# Patient Record
Sex: Male | Born: 1980 | Race: Black or African American | Hispanic: No | Marital: Single
Health system: Southern US, Community
[De-identification: ages and names within clinical notes are randomized; demographics above are authoritative.]

## PROBLEM LIST (undated history)

## (undated) DIAGNOSIS — F209 Schizophrenia, unspecified: Secondary | ICD-10-CM

## (undated) DIAGNOSIS — F1910 Other psychoactive substance abuse, uncomplicated: Secondary | ICD-10-CM

---

## 2006-03-21 ENCOUNTER — Ambulatory Visit: Payer: Self-pay

## 2006-12-30 ENCOUNTER — Inpatient Hospital Stay: Payer: Self-pay | Admitting: Unknown Physician Specialty

## 2007-01-08 ENCOUNTER — Emergency Department: Payer: Self-pay | Admitting: Emergency Medicine

## 2008-08-30 ENCOUNTER — Emergency Department: Payer: Self-pay | Admitting: Emergency Medicine

## 2008-11-23 ENCOUNTER — Emergency Department: Payer: Self-pay | Admitting: Emergency Medicine

## 2009-08-23 ENCOUNTER — Emergency Department: Payer: Self-pay | Admitting: Emergency Medicine

## 2009-09-24 ENCOUNTER — Emergency Department: Payer: Self-pay | Admitting: Emergency Medicine

## 2009-11-12 ENCOUNTER — Inpatient Hospital Stay: Payer: Self-pay | Admitting: Unknown Physician Specialty

## 2010-05-31 ENCOUNTER — Ambulatory Visit: Payer: Self-pay | Admitting: Internal Medicine

## 2010-08-06 ENCOUNTER — Inpatient Hospital Stay: Payer: Self-pay | Admitting: Unknown Physician Specialty

## 2011-11-19 ENCOUNTER — Emergency Department: Payer: Self-pay | Admitting: Emergency Medicine

## 2011-11-19 LAB — SALICYLATE LEVEL: Salicylates, Serum: 2.2 mg/dL

## 2011-11-19 LAB — URINALYSIS, COMPLETE
Bilirubin,UR: NEGATIVE
Blood: NEGATIVE
Nitrite: NEGATIVE
Ph: 5 (ref 4.5–8.0)
Protein: NEGATIVE
RBC,UR: NONE SEEN /HPF (ref 0–5)
Specific Gravity: 1.011 (ref 1.003–1.030)

## 2011-11-19 LAB — DRUG SCREEN, URINE
Amphetamines, Ur Screen: NEGATIVE (ref ?–1000)
Barbiturates, Ur Screen: NEGATIVE (ref ?–200)
Benzodiazepine, Ur Scrn: NEGATIVE (ref ?–200)
Cannabinoid 50 Ng, Ur ~~LOC~~: NEGATIVE (ref ?–50)
Cocaine Metabolite,Ur ~~LOC~~: NEGATIVE (ref ?–300)
Phencyclidine (PCP) Ur S: NEGATIVE (ref ?–25)

## 2011-11-19 LAB — COMPREHENSIVE METABOLIC PANEL
Albumin: 4.1 g/dL (ref 3.4–5.0)
Anion Gap: 11 (ref 7–16)
BUN: 14 mg/dL (ref 7–18)
Bilirubin,Total: 0.5 mg/dL (ref 0.2–1.0)
Chloride: 103 mmol/L (ref 98–107)
Co2: 26 mmol/L (ref 21–32)
Creatinine: 1 mg/dL (ref 0.60–1.30)
EGFR (African American): >60
EGFR (Non-African Amer.): >60
Glucose: 103 mg/dL — ABNORMAL HIGH (ref 65–99)
Osmolality: 280 (ref 275–301)
Potassium: 3.6 mmol/L (ref 3.5–5.1)
Sodium: 140 mmol/L (ref 136–145)
Total Protein: 7.7 g/dL (ref 6.4–8.2)

## 2011-11-19 LAB — ETHANOL
Ethanol %: 0.014 % (ref 0.000–0.080)
Ethanol: 14 mg/dL

## 2011-11-19 LAB — ACETAMINOPHEN LEVEL: Acetaminophen: <2 ug/mL

## 2011-11-19 LAB — CBC
HCT: 44.3 % (ref 40.0–52.0)
HGB: 15.4 g/dL (ref 13.0–18.0)
MCH: 33.8 pg (ref 26.0–34.0)
MCHC: 34.7 g/dL (ref 32.0–36.0)
MCV: 98 fL (ref 80–100)
RDW: 12.2 % (ref 11.5–14.5)
WBC: 6.4 10*3/uL (ref 3.8–10.6)

## 2011-11-19 LAB — T4, FREE: Free Thyroxine: 1.38 ng/dL (ref 0.76–1.46)

## 2011-11-19 LAB — TSH: Thyroid Stimulating Horm: <0.01 u[IU]/mL — ABNORMAL LOW

## 2011-11-21 ENCOUNTER — Inpatient Hospital Stay: Payer: Self-pay | Admitting: Psychiatry

## 2011-11-21 LAB — CBC
MCH: 33.1 pg (ref 26.0–34.0)
MCHC: 33.8 g/dL (ref 32.0–36.0)
MCV: 98 fL (ref 80–100)
Platelet: 163 10*3/uL (ref 150–440)
RBC: 4.43 10*6/uL (ref 4.40–5.90)

## 2011-11-21 LAB — DRUG SCREEN, URINE
Amphetamines, Ur Screen: NEGATIVE (ref ?–1000)
Barbiturates, Ur Screen: NEGATIVE (ref ?–200)
Cannabinoid 50 Ng, Ur ~~LOC~~: NEGATIVE (ref ?–50)
Methadone, Ur Screen: NEGATIVE (ref ?–300)
Opiate, Ur Screen: NEGATIVE (ref ?–300)
Tricyclic, Ur Screen: NEGATIVE (ref ?–1000)

## 2011-11-21 LAB — COMPREHENSIVE METABOLIC PANEL
Albumin: 4.3 g/dL (ref 3.4–5.0)
Alkaline Phosphatase: 128 U/L (ref 50–136)
BUN: 13 mg/dL (ref 7–18)
Bilirubin,Total: 0.4 mg/dL (ref 0.2–1.0)
Creatinine: 0.9 mg/dL (ref 0.60–1.30)
Osmolality: 279 (ref 275–301)
Sodium: 140 mmol/L (ref 136–145)
Total Protein: 7.8 g/dL (ref 6.4–8.2)

## 2011-11-21 LAB — TSH: Thyroid Stimulating Horm: <0.01 u[IU]/mL — ABNORMAL LOW

## 2011-11-21 LAB — ACETAMINOPHEN LEVEL: Acetaminophen: <2 ug/mL

## 2013-10-11 ENCOUNTER — Emergency Department: Payer: Self-pay | Admitting: Emergency Medicine

## 2013-10-11 LAB — COMPREHENSIVE METABOLIC PANEL
ALK PHOS: 128 U/L — AB
AST: 22 U/L (ref 15–37)
Albumin: 3.9 g/dL (ref 3.4–5.0)
Anion Gap: 7 (ref 7–16)
BUN: 7 mg/dL (ref 7–18)
Bilirubin,Total: 0.4 mg/dL (ref 0.2–1.0)
CREATININE: 0.85 mg/dL (ref 0.60–1.30)
Calcium, Total: 8.8 mg/dL (ref 8.5–10.1)
Chloride: 103 mmol/L (ref 98–107)
Co2: 27 mmol/L (ref 21–32)
EGFR (Non-African Amer.): >60
Glucose: 140 mg/dL — ABNORMAL HIGH (ref 65–99)
OSMOLALITY: 274 (ref 275–301)
POTASSIUM: 3.6 mmol/L (ref 3.5–5.1)
SGPT (ALT): 22 U/L
Sodium: 137 mmol/L (ref 136–145)
Total Protein: 7.7 g/dL (ref 6.4–8.2)

## 2013-10-11 LAB — URINALYSIS, COMPLETE
BILIRUBIN, UR: NEGATIVE
Bacteria: NONE SEEN
Blood: NEGATIVE
GLUCOSE, UR: NEGATIVE mg/dL (ref 0–75)
Hyaline Cast: 2
KETONE: NEGATIVE
Leukocyte Esterase: NEGATIVE
NITRITE: NEGATIVE
Ph: 5 (ref 4.5–8.0)
Protein: NEGATIVE
Specific Gravity: 1.023 (ref 1.003–1.030)
Squamous Epithelial: <1
WBC UR: NONE SEEN /HPF (ref 0–5)

## 2013-10-11 LAB — CBC
HCT: 45.5 % (ref 40.0–52.0)
HGB: 15 g/dL (ref 13.0–18.0)
MCH: 31 pg (ref 26.0–34.0)
MCHC: 33.1 g/dL (ref 32.0–36.0)
MCV: 94 fL (ref 80–100)
Platelet: 159 10*3/uL (ref 150–440)
RBC: 4.84 10*6/uL (ref 4.40–5.90)
RDW: 12.7 % (ref 11.5–14.5)
WBC: 4.4 10*3/uL (ref 3.8–10.6)

## 2013-10-11 LAB — DRUG SCREEN, URINE
Amphetamines, Ur Screen: NEGATIVE (ref ?–1000)
BARBITURATES, UR SCREEN: NEGATIVE (ref ?–200)
BENZODIAZEPINE, UR SCRN: NEGATIVE (ref ?–200)
CANNABINOID 50 NG, UR ~~LOC~~: POSITIVE (ref ?–50)
Cocaine Metabolite,Ur ~~LOC~~: NEGATIVE (ref ?–300)
MDMA (Ecstasy)Ur Screen: NEGATIVE (ref ?–500)
Methadone, Ur Screen: NEGATIVE (ref ?–300)
Opiate, Ur Screen: NEGATIVE (ref ?–300)
PHENCYCLIDINE (PCP) UR S: NEGATIVE (ref ?–25)
TRICYCLIC, UR SCREEN: NEGATIVE (ref ?–1000)

## 2013-10-11 LAB — SALICYLATE LEVEL: Salicylates, Serum: 2.1 mg/dL

## 2013-10-11 LAB — ETHANOL: Ethanol: <3 mg/dL

## 2013-10-11 LAB — ACETAMINOPHEN LEVEL: Acetaminophen: <2 ug/mL

## 2014-03-03 ENCOUNTER — Ambulatory Visit: Payer: Self-pay | Admitting: Neurology

## 2014-03-03 ENCOUNTER — Inpatient Hospital Stay: Payer: Self-pay

## 2014-03-03 DIAGNOSIS — I639 Cerebral infarction, unspecified: Secondary | ICD-10-CM

## 2014-03-03 LAB — CBC WITH DIFFERENTIAL/PLATELET
BASOS ABS: 0 10*3/uL (ref 0.0–0.1)
BASOS PCT: 0.1 %
Eosinophil #: 0 10*3/uL (ref 0.0–0.7)
Eosinophil %: 0.3 %
HCT: 45.1 % (ref 40.0–52.0)
HGB: 14.9 g/dL (ref 13.0–18.0)
LYMPHS ABS: 0.7 10*3/uL — AB (ref 1.0–3.6)
LYMPHS PCT: 8.6 %
MCH: 29.4 pg (ref 26.0–34.0)
MCHC: 33.1 g/dL (ref 32.0–36.0)
MCV: 89 fL (ref 80–100)
Monocyte #: 0.5 x10 3/mm (ref 0.2–1.0)
Monocyte %: 6.7 %
NEUTROS ABS: 6.8 10*3/uL — AB (ref 1.4–6.5)
NEUTROS PCT: 84.3 %
Platelet: 184 10*3/uL (ref 150–440)
RBC: 5.07 10*6/uL (ref 4.40–5.90)
RDW: 12 % (ref 11.5–14.5)
WBC: 8.1 10*3/uL (ref 3.8–10.6)

## 2014-03-03 LAB — COMPREHENSIVE METABOLIC PANEL
ALBUMIN: 3.9 g/dL (ref 3.4–5.0)
ANION GAP: 12 (ref 7–16)
Alkaline Phosphatase: 136 U/L — ABNORMAL HIGH
BILIRUBIN TOTAL: 0.6 mg/dL (ref 0.2–1.0)
BUN: 20 mg/dL — AB (ref 7–18)
CALCIUM: 9.8 mg/dL (ref 8.5–10.1)
Chloride: 104 mmol/L (ref 98–107)
Co2: 24 mmol/L (ref 21–32)
Creatinine: 0.98 mg/dL (ref 0.60–1.30)
EGFR (African American): >60
Glucose: 157 mg/dL — ABNORMAL HIGH (ref 65–99)
OSMOLALITY: 285 (ref 275–301)
Potassium: 4.1 mmol/L (ref 3.5–5.1)
SGOT(AST): 38 U/L — ABNORMAL HIGH (ref 15–37)
SGPT (ALT): 34 U/L
Sodium: 140 mmol/L (ref 136–145)
TOTAL PROTEIN: 8.1 g/dL (ref 6.4–8.2)

## 2014-03-03 LAB — URINALYSIS, COMPLETE
Bilirubin,UR: NEGATIVE
Blood: NEGATIVE
Glucose,UR: NEGATIVE mg/dL (ref 0–75)
Ketone: NEGATIVE
LEUKOCYTE ESTERASE: NEGATIVE
Nitrite: NEGATIVE
Ph: 5 (ref 4.5–8.0)
Protein: NEGATIVE
SPECIFIC GRAVITY: 1.027 (ref 1.003–1.030)
Squamous Epithelial: NONE SEEN

## 2014-03-03 LAB — DRUG SCREEN, URINE
AMPHETAMINES, UR SCREEN: NEGATIVE (ref ?–1000)
Barbiturates, Ur Screen: NEGATIVE (ref ?–200)
Benzodiazepine, Ur Scrn: POSITIVE (ref ?–200)
Cannabinoid 50 Ng, Ur ~~LOC~~: NEGATIVE (ref ?–50)
Cocaine Metabolite,Ur ~~LOC~~: NEGATIVE (ref ?–300)
MDMA (ECSTASY) UR SCREEN: NEGATIVE (ref ?–500)
Methadone, Ur Screen: NEGATIVE (ref ?–300)
Opiate, Ur Screen: NEGATIVE (ref ?–300)
PHENCYCLIDINE (PCP) UR S: NEGATIVE (ref ?–25)
Tricyclic, Ur Screen: NEGATIVE (ref ?–1000)

## 2014-03-03 LAB — TROPONIN I
Troponin-I: 0.1 ng/mL — ABNORMAL HIGH
Troponin-I: 0.19 ng/mL — ABNORMAL HIGH
Troponin-I: 0.15 ng/mL — ABNORMAL HIGH

## 2014-03-03 LAB — CK TOTAL AND CKMB (NOT AT ARMC)
CK, TOTAL: 113 U/L (ref 39–308)
CK, TOTAL: 153 U/L (ref 39–308)
CK, Total: 148 U/L (ref 39–308)
CK, Total: 135 U/L (ref 39–308)
CK-MB: 1 ng/mL (ref 0.5–3.6)
CK-MB: 0.9 ng/mL (ref 0.5–3.6)
CK-MB: 0.9 ng/mL (ref 0.5–3.6)
CK-MB: 0.9 ng/mL (ref 0.5–3.6)

## 2014-03-04 LAB — BASIC METABOLIC PANEL
Anion Gap: 9 (ref 7–16)
BUN: 13 mg/dL (ref 7–18)
CHLORIDE: 107 mmol/L (ref 98–107)
CO2: 26 mmol/L (ref 21–32)
Calcium, Total: 8.5 mg/dL (ref 8.5–10.1)
Creatinine: 1.07 mg/dL (ref 0.60–1.30)
EGFR (African American): >60
Glucose: 168 mg/dL — ABNORMAL HIGH (ref 65–99)
OSMOLALITY: 287 (ref 275–301)
Potassium: 3.4 mmol/L — ABNORMAL LOW (ref 3.5–5.1)
Sodium: 142 mmol/L (ref 136–145)

## 2014-03-04 LAB — CBC WITH DIFFERENTIAL/PLATELET
Basophil #: 0 10*3/uL (ref 0.0–0.1)
Basophil %: 0.3 %
EOS PCT: 0.8 %
Eosinophil #: 0 10*3/uL (ref 0.0–0.7)
HCT: 40.1 % (ref 40.0–52.0)
HGB: 12.9 g/dL — ABNORMAL LOW (ref 13.0–18.0)
Lymphocyte #: 2 10*3/uL (ref 1.0–3.6)
Lymphocyte %: 35.8 %
MCH: 29.1 pg (ref 26.0–34.0)
MCHC: 32.1 g/dL (ref 32.0–36.0)
MCV: 91 fL (ref 80–100)
MONOS PCT: 16.3 %
Monocyte #: 0.9 x10 3/mm (ref 0.2–1.0)
NEUTROS ABS: 2.6 10*3/uL (ref 1.4–6.5)
NEUTROS PCT: 46.8 %
Platelet: 134 10*3/uL — ABNORMAL LOW (ref 150–440)
RBC: 4.43 10*6/uL (ref 4.40–5.90)
RDW: 12.1 % (ref 11.5–14.5)
WBC: 5.5 10*3/uL (ref 3.8–10.6)

## 2014-03-04 LAB — MAGNESIUM: Magnesium: 1.4 mg/dL — ABNORMAL LOW

## 2014-03-04 LAB — PHOSPHORUS: PHOSPHORUS: 4.5 mg/dL (ref 2.5–4.9)

## 2014-03-05 LAB — COMPREHENSIVE METABOLIC PANEL
ALBUMIN: 2.5 g/dL — AB (ref 3.4–5.0)
ALT: 22 U/L
Alkaline Phosphatase: 83 U/L
Anion Gap: 8 (ref 7–16)
BUN: 13 mg/dL (ref 7–18)
Bilirubin,Total: 1.1 mg/dL — ABNORMAL HIGH (ref 0.2–1.0)
Calcium, Total: 8 mg/dL — ABNORMAL LOW (ref 8.5–10.1)
Chloride: 106 mmol/L (ref 98–107)
Co2: 26 mmol/L (ref 21–32)
Creatinine: 0.85 mg/dL (ref 0.60–1.30)
EGFR (Non-African Amer.): >60
GLUCOSE: 157 mg/dL — AB (ref 65–99)
Osmolality: 283 (ref 275–301)
Potassium: 4.1 mmol/L (ref 3.5–5.1)
SGOT(AST): 28 U/L (ref 15–37)
Sodium: 140 mmol/L (ref 136–145)
TOTAL PROTEIN: 5.4 g/dL — AB (ref 6.4–8.2)

## 2014-03-05 LAB — CBC WITH DIFFERENTIAL/PLATELET
BASOS PCT: 0.3 %
Basophil #: 0 10*3/uL (ref 0.0–0.1)
Comment - H1-Com1: NORMAL
Comment - H1-Com2: NORMAL
EOS ABS: 0 10*3/uL (ref 0.0–0.7)
Eosinophil %: 0.5 %
HCT: 37.5 % — AB (ref 40.0–52.0)
HCT: 34.1 % — ABNORMAL LOW (ref 40.0–52.0)
HGB: 12.3 g/dL — AB (ref 13.0–18.0)
HGB: 11 g/dL — ABNORMAL LOW (ref 13.0–18.0)
LYMPHS ABS: 1.7 10*3/uL (ref 1.0–3.6)
Lymphocyte %: 30 %
Lymphocytes: 18 %
MCH: 29.4 pg (ref 26.0–34.0)
MCH: 29.2 pg (ref 26.0–34.0)
MCHC: 32.8 g/dL (ref 32.0–36.0)
MCHC: 32.3 g/dL (ref 32.0–36.0)
MCV: 90 fL (ref 80–100)
MCV: 91 fL (ref 80–100)
MONO ABS: 0.9 x10 3/mm (ref 0.2–1.0)
Monocyte %: 15.7 %
Monocytes: 8 %
Neutrophil #: 3.1 10*3/uL (ref 1.4–6.5)
Neutrophil %: 53.5 %
PLATELETS: 109 10*3/uL — AB (ref 150–440)
Platelet: 116 10*3/uL — ABNORMAL LOW (ref 150–440)
RBC: 4.19 10*6/uL — ABNORMAL LOW (ref 4.40–5.90)
RBC: 3.77 10*6/uL — ABNORMAL LOW (ref 4.40–5.90)
RDW: 12 % (ref 11.5–14.5)
RDW: 12.2 % (ref 11.5–14.5)
SEGMENTED NEUTROPHILS: 74 %
WBC: 5.8 10*3/uL (ref 3.8–10.6)
WBC: 8.1 10*3/uL (ref 3.8–10.6)

## 2014-03-05 LAB — ALBUMIN: ALBUMIN: 2.7 g/dL — AB (ref 3.4–5.0)

## 2014-03-05 LAB — CSF CELL CT + PROT + GLU PANEL
CSF Tube #: 3
Eosinophil: 0 %
Glucose, CSF: 86 mg/dL — ABNORMAL HIGH (ref 40–75)
Lymphocytes: 8 %
Monocytes/Macrophages: 7 %
NEUTROS PCT: 85 %
Other Cells: 0 %
Protein, CSF: 72 mg/dL — ABNORMAL HIGH (ref 15–45)
RBC (CSF): 83 /mm3
WBC (CSF): 356 /mm3

## 2014-03-05 LAB — RAPID HIV SCREEN (HIV 1/2 AB+AG)

## 2014-03-05 LAB — BASIC METABOLIC PANEL
Anion Gap: 6 — ABNORMAL LOW (ref 7–16)
BUN: 12 mg/dL (ref 7–18)
CALCIUM: 8.9 mg/dL (ref 8.5–10.1)
CHLORIDE: 107 mmol/L (ref 98–107)
Co2: 27 mmol/L (ref 21–32)
Creatinine: 0.93 mg/dL (ref 0.60–1.30)
EGFR (African American): >60
EGFR (Non-African Amer.): >60
Glucose: 141 mg/dL — ABNORMAL HIGH (ref 65–99)
Osmolality: 282 (ref 275–301)
POTASSIUM: 4.1 mmol/L (ref 3.5–5.1)
Sodium: 140 mmol/L (ref 136–145)

## 2014-03-05 LAB — VANCOMYCIN, TROUGH: VANCOMYCIN, TROUGH: 7 ug/mL — AB (ref 10–20)

## 2014-03-05 LAB — MAGNESIUM
Magnesium: 1.5 mg/dL — ABNORMAL LOW
Magnesium: 1.4 mg/dL — ABNORMAL LOW

## 2014-03-05 LAB — SEDIMENTATION RATE: Erythrocyte Sed Rate: 63 mm/hr — ABNORMAL HIGH (ref 0–15)

## 2014-03-05 LAB — PHOSPHORUS: Phosphorus: 3.8 mg/dL (ref 2.5–4.9)

## 2014-03-05 LAB — PHENYTOIN LEVEL, TOTAL
DILANTIN: 12.8 ug/mL (ref 10.0–20.0)
DILANTIN: 11.3 ug/mL (ref 10.0–20.0)

## 2014-03-06 ENCOUNTER — Encounter (HOSPITAL_COMMUNITY): Payer: Self-pay | Admitting: Internal Medicine

## 2014-03-06 ENCOUNTER — Inpatient Hospital Stay (HOSPITAL_COMMUNITY): Payer: Medicare Other

## 2014-03-06 ENCOUNTER — Inpatient Hospital Stay (HOSPITAL_COMMUNITY)
Admission: AD | Admit: 2014-03-06 | Discharge: 2014-04-05 | DRG: 064 | Disposition: E | Payer: Medicare Other | Source: Other Acute Inpatient Hospital | Attending: Internal Medicine | Admitting: Internal Medicine

## 2014-03-06 DIAGNOSIS — I639 Cerebral infarction, unspecified: Secondary | ICD-10-CM

## 2014-03-06 DIAGNOSIS — F209 Schizophrenia, unspecified: Secondary | ICD-10-CM | POA: Diagnosis not present

## 2014-03-06 DIAGNOSIS — I63431 Cerebral infarction due to embolism of right posterior cerebral artery: Secondary | ICD-10-CM | POA: Diagnosis not present

## 2014-03-06 DIAGNOSIS — I63412 Cerebral infarction due to embolism of left middle cerebral artery: Principal | ICD-10-CM | POA: Diagnosis present

## 2014-03-06 DIAGNOSIS — G9340 Encephalopathy, unspecified: Secondary | ICD-10-CM | POA: Diagnosis not present

## 2014-03-06 DIAGNOSIS — G40901 Epilepsy, unspecified, not intractable, with status epilepticus: Secondary | ICD-10-CM | POA: Diagnosis not present

## 2014-03-06 DIAGNOSIS — G936 Cerebral edema: Secondary | ICD-10-CM | POA: Diagnosis not present

## 2014-03-06 DIAGNOSIS — I629 Nontraumatic intracranial hemorrhage, unspecified: Secondary | ICD-10-CM | POA: Diagnosis present

## 2014-03-06 DIAGNOSIS — Z79899 Other long term (current) drug therapy: Secondary | ICD-10-CM | POA: Diagnosis not present

## 2014-03-06 DIAGNOSIS — Z66 Do not resuscitate: Secondary | ICD-10-CM | POA: Diagnosis present

## 2014-03-06 DIAGNOSIS — I63531 Cerebral infarction due to unspecified occlusion or stenosis of right posterior cerebral artery: Secondary | ICD-10-CM | POA: Diagnosis not present

## 2014-03-06 DIAGNOSIS — R569 Unspecified convulsions: Secondary | ICD-10-CM | POA: Diagnosis not present

## 2014-03-06 DIAGNOSIS — G935 Compression of brain: Secondary | ICD-10-CM | POA: Diagnosis present

## 2014-03-06 DIAGNOSIS — Z01818 Encounter for other preprocedural examination: Secondary | ICD-10-CM

## 2014-03-06 HISTORY — DX: Other psychoactive substance abuse, uncomplicated: F19.10

## 2014-03-06 HISTORY — DX: Schizophrenia, unspecified: F20.9

## 2014-03-06 LAB — HEPATIC FUNCTION PANEL
ALK PHOS: 70 U/L (ref 39–117)
ALT: 16 U/L (ref 0–53)
AST: 20 U/L (ref 0–37)
Albumin: 2.6 g/dL — ABNORMAL LOW (ref 3.5–5.2)
BILIRUBIN TOTAL: 0.6 mg/dL (ref 0.3–1.2)
Bilirubin, Direct: 0.2 mg/dL (ref 0.0–0.3)
Indirect Bilirubin: 0.4 mg/dL (ref 0.3–0.9)
Total Protein: 6 g/dL (ref 6.0–8.3)

## 2014-03-06 LAB — BASIC METABOLIC PANEL
ANION GAP: 5 — AB (ref 7–16)
Anion gap: 8 (ref 5–15)
BUN: 13 mg/dL (ref 7–18)
BUN: 11 mg/dL (ref 6–23)
CALCIUM: 8.3 mg/dL — AB (ref 8.5–10.1)
CO2: 29 mmol/L (ref 21–32)
CO2: 26 mmol/L (ref 19–32)
Calcium: 9 mg/dL (ref 8.4–10.5)
Chloride: 107 mmol/L (ref 98–107)
Chloride: 106 mEq/L (ref 96–112)
Creatinine, Ser: 0.57 mg/dL (ref 0.50–1.35)
Creatinine: 0.71 mg/dL (ref 0.60–1.30)
EGFR (African American): >60
GFR calc Af Amer: >90 mL/min (ref >90)
GFR calc non Af Amer: >90 mL/min (ref >90)
Glucose, Bld: 178 mg/dL — ABNORMAL HIGH (ref 70–99)
Glucose: 162 mg/dL — ABNORMAL HIGH (ref 65–99)
Osmolality: 285 (ref 275–301)
POTASSIUM: 3.8 mmol/L (ref 3.5–5.1)
Potassium: 3.7 mmol/L (ref 3.5–5.1)
SODIUM: 140 mmol/L (ref 135–145)
Sodium: 141 mmol/L (ref 136–145)

## 2014-03-06 LAB — BLOOD GAS, ARTERIAL
ACID-BASE EXCESS: 2 mmol/L (ref 0.0–2.0)
ACID-BASE EXCESS: 3 mmol/L — AB (ref 0.0–2.0)
BICARBONATE: 26.3 meq/L — AB (ref 20.0–24.0)
Bicarbonate: 25 mEq/L — ABNORMAL HIGH (ref 20.0–24.0)
DRAWN BY: 24513
Drawn by: 330991
FIO2: 0.3 %
FIO2: 30 %
LHR: 20 {breaths}/min
LHR: 26 {breaths}/min
MECHVT: 450 mL
MECHVT: 580 mL
O2 Saturation: 98.9 %
O2 Saturation: 99.6 %
PCO2 ART: 25.4 mmHg — AB (ref 35.0–45.0)
PEEP/CPAP: 5 cmH2O
PEEP/CPAP: 5 cmH2O
PH ART: 7.378 (ref 7.350–7.450)
PO2 ART: 141 mmHg — AB (ref 80.0–100.0)
Patient temperature: 101.2
Patient temperature: 98.1
TCO2: 27.6 mmol/L (ref 0–100)
TCO2: 25.8 mmol/L (ref 0–100)
pCO2 arterial: 46.6 mmHg — ABNORMAL HIGH (ref 35.0–45.0)
pH, Arterial: 7.598 — ABNORMAL HIGH (ref 7.350–7.450)
pO2, Arterial: 154 mmHg — ABNORMAL HIGH (ref 80.0–100.0)

## 2014-03-06 LAB — TYPE AND SCREEN
ABO/RH(D): O POS
ANTIBODY SCREEN: NEGATIVE

## 2014-03-06 LAB — CBC WITH DIFFERENTIAL/PLATELET
Basophils Absolute: 0 10*3/uL (ref 0.0–0.1)
Basophils Relative: 0 % (ref 0–1)
Eosinophils Absolute: 0 10*3/uL (ref 0.0–0.7)
Eosinophils Relative: 0 % (ref 0–5)
HCT: 32.3 % — ABNORMAL LOW (ref 39.0–52.0)
Hemoglobin: 10.4 g/dL — ABNORMAL LOW (ref 13.0–17.0)
LYMPHS ABS: 1.2 10*3/uL (ref 0.7–4.0)
LYMPHS PCT: 21 % (ref 12–46)
MCH: 28.2 pg (ref 26.0–34.0)
MCHC: 32.2 g/dL (ref 30.0–36.0)
MCV: 87.5 fL (ref 78.0–100.0)
Monocytes Absolute: 0.7 10*3/uL (ref 0.1–1.0)
Monocytes Relative: 12 % (ref 3–12)
NEUTROS ABS: 3.8 10*3/uL (ref 1.7–7.7)
Neutrophils Relative %: 67 % (ref 43–77)
PLATELETS: 129 10*3/uL — AB (ref 150–400)
RBC: 3.69 MIL/uL — ABNORMAL LOW (ref 4.22–5.81)
RDW: 11.8 % (ref 11.5–15.5)
WBC: 5.6 10*3/uL (ref 4.0–10.5)

## 2014-03-06 LAB — ABO/RH: ABO/RH(D): O POS

## 2014-03-06 LAB — GLUCOSE, CAPILLARY
GLUCOSE-CAPILLARY: 163 mg/dL — AB (ref 70–99)
Glucose-Capillary: 124 mg/dL — ABNORMAL HIGH (ref 70–99)

## 2014-03-06 LAB — PROTIME-INR
INR: 1.21 (ref 0.00–1.49)
PROTHROMBIN TIME: 15.5 s — AB (ref 11.6–15.2)

## 2014-03-06 LAB — ALBUMIN: Albumin: 2.2 g/dL — ABNORMAL LOW (ref 3.4–5.0)

## 2014-03-06 LAB — SODIUM: Sodium: 141 mmol/L (ref 135–145)

## 2014-03-06 LAB — MAGNESIUM: Magnesium: 1.5 mg/dL — ABNORMAL LOW

## 2014-03-06 LAB — APTT: aPTT: 32 seconds (ref 24–37)

## 2014-03-06 LAB — MRSA PCR SCREENING: MRSA BY PCR: NEGATIVE

## 2014-03-06 LAB — PHENYTOIN LEVEL, TOTAL: Dilantin: 8.9 ug/mL — ABNORMAL LOW (ref 10.0–20.0)

## 2014-03-06 LAB — PHOSPHORUS: Phosphorus: 3.7 mg/dL (ref 2.5–4.9)

## 2014-03-06 LAB — VANCOMYCIN, TROUGH: Vancomycin, Trough: 8 ug/mL — ABNORMAL LOW (ref 10–20)

## 2014-03-06 MED ORDER — MIDAZOLAM BOLUS VIA INFUSION
1.0000 mg | INTRAVENOUS | Status: DC | PRN
  Filled 2014-03-06: qty 2

## 2014-03-06 MED ORDER — ACETAMINOPHEN 650 MG RE SUPP: 650.0000 mg | RECTAL | Status: DC | PRN

## 2014-03-06 MED ORDER — SODIUM CHLORIDE 0.9 % IV SOLN
1.0000 ug/h | INTRAVENOUS | Status: DC
  Administered 2014-03-06 (×2): 100 ug/h via INTRAVENOUS
  Filled 2014-03-06: qty 50

## 2014-03-06 MED ORDER — PIPERACILLIN-TAZOBACTAM 3.375 G IVPB
3.3750 g | Freq: Three times a day (TID) | INTRAVENOUS | Status: DC
  Administered 2014-03-06: 3.375 g via INTRAVENOUS
  Filled 2014-03-06 (×2): qty 50

## 2014-03-06 MED ORDER — SODIUM CHLORIDE 0.9 % IV SOLN
0.0000 mg/h | INTRAVENOUS | Status: DC
  Administered 2014-03-06 (×2): 3 mg/h via INTRAVENOUS
  Filled 2014-03-06: qty 10

## 2014-03-06 MED ORDER — STROKE: EARLY STAGES OF RECOVERY BOOK
Freq: Once | Status: DC
  Filled 2014-03-06: qty 1

## 2014-03-06 MED ORDER — CHLORHEXIDINE GLUCONATE 0.12 % MT SOLN
15.0000 mL | Freq: Two times a day (BID) | OROMUCOSAL | Status: DC
  Administered 2014-03-06: 15 mL via OROMUCOSAL
  Filled 2014-03-06: qty 15

## 2014-03-06 MED ORDER — VANCOMYCIN HCL IN DEXTROSE 1-5 GM/200ML-% IV SOLN
1000.0000 mg | Freq: Three times a day (TID) | INTRAVENOUS | Status: DC
  Filled 2014-03-06: qty 200

## 2014-03-06 MED ORDER — CETYLPYRIDINIUM CHLORIDE 0.05 % MT LIQD: 7.0000 mL | Freq: Four times a day (QID) | OROMUCOSAL | Status: DC

## 2014-03-06 MED ORDER — LEVETIRACETAM IN NACL 500 MG/100ML IV SOLN
500.0000 mg | Freq: Two times a day (BID) | INTRAVENOUS | Status: DC
  Administered 2014-03-06: 500 mg via INTRAVENOUS
  Filled 2014-03-06 (×2): qty 100

## 2014-03-06 MED ORDER — MANNITOL 25 % IV SOLN
25.0000 g | Freq: Four times a day (QID) | INTRAVENOUS | Status: DC
  Filled 2014-03-06 (×2): qty 100
  Filled 2014-03-06: qty 50
  Filled 2014-03-06 (×2): qty 100

## 2014-03-06 MED ORDER — HYDRALAZINE HCL 20 MG/ML IJ SOLN: 10.0000 mg | INTRAMUSCULAR | Status: DC | PRN

## 2014-03-06 MED ORDER — VANCOMYCIN HCL 10 G IV SOLR
1250.0000 mg | Freq: Once | INTRAVENOUS | Status: DC
  Administered 2014-03-06: 1250 mg via INTRAVENOUS
  Filled 2014-03-06: qty 1250

## 2014-03-06 MED ORDER — SODIUM CHLORIDE 0.9 % IV SOLN: INTRAVENOUS | Status: DC

## 2014-03-06 MED ORDER — PHENYTOIN SODIUM 50 MG/ML IJ SOLN
100.0000 mg | Freq: Three times a day (TID) | INTRAMUSCULAR | Status: DC
  Administered 2014-03-06: 100 mg via INTRAVENOUS
  Filled 2014-03-06 (×2): qty 2

## 2014-03-06 MED ORDER — FENTANYL BOLUS VIA INFUSION
25.0000 ug | INTRAVENOUS | Status: DC | PRN
  Filled 2014-03-06: qty 50

## 2014-03-06 MED ORDER — SODIUM CHLORIDE 0.9 % IV SOLN
1.0000 mg/h | INTRAVENOUS | Status: DC
  Administered 2014-03-06 (×2): 1 mg/h via INTRAVENOUS
  Filled 2014-03-06: qty 5

## 2014-03-06 MED ORDER — ACETAMINOPHEN 325 MG PO TABS
650.0000 mg | ORAL_TABLET | ORAL | Status: DC | PRN
Start: 2014-03-06 — End: 2014-03-06
  Administered 2014-03-06: 650 mg via ORAL
  Filled 2014-03-06: qty 2

## 2014-03-06 MED ORDER — SODIUM CHLORIDE 0.9 % IJ SOLN
10.0000 mL | INTRAMUSCULAR | Status: DC | PRN
  Administered 2014-03-06: 20 mL

## 2014-03-06 MED ORDER — PANTOPRAZOLE SODIUM 40 MG IV SOLR: 40.0000 mg | Freq: Every day | INTRAVENOUS | Status: DC

## 2014-03-06 MED ORDER — HEPARIN SODIUM (PORCINE) 5000 UNIT/ML IJ SOLN
5000.0000 [IU] | Freq: Three times a day (TID) | INTRAMUSCULAR | Status: DC
  Administered 2014-03-06: 5000 [IU] via SUBCUTANEOUS

## 2014-03-06 MED ORDER — SODIUM CHLORIDE 3 % IV SOLN
INTRAVENOUS | Status: DC
  Administered 2014-03-06: 25 mL/h via INTRAVENOUS
  Filled 2014-03-06: qty 500

## 2014-03-06 NOTE — Procedures (Signed)
Arterial Catheter Insertion Procedure Note Jamar Weatherall [id number] 1980-05-03  Procedure: Insertion of Arterial Catheter  Indications: Blood pressure monitoring and Frequent blood sampling  Procedure Details Consent: Unable to obtain consent because of emergent medical necessity. Time Out: Verified patient identification, verified procedure, site/side was marked, verified correct patient position, special equipment/implants available, medications/allergies/relevent history reviewed, required imaging and test results available.  Performed  Maximum sterile technique was used including antiseptics, cap, gloves, gown, hand hygiene, mask and sheet. Skin prep: Chlorhexidine; local anesthetic administered 20 gauge catheter was inserted into right radial artery using the Seldinger technique.  Evaluation Blood flow good; BP tracing good. Complications: No apparent complications.   Vilinda Blanks, Burna Cash 03/15/2014

## 2014-03-06 NOTE — Progress Notes (Addendum)
ANTIBIOTIC CONSULT NOTE - INITIAL  Pharmacy Consult for vancomycin and Zosyn Indication: septic emboli  No Known Allergies  Patient Measurements:     Vital Signs: Temp: 101.2 F (38.4 C) (01/02 1842) Temp Source: Oral (01/02 1842) BP: 158/98 mmHg (01/02 1915) Pulse Rate: 135 (01/02 1915) Intake/Output from previous day:   Intake/Output from this shift:    Labs: No results for input(s): WBC, HGB, PLT, LABCREA, CREATININE in the last 72 hours. CrCl cannot be calculated (Unknown ideal weight.). No results for input(s): VANCOTROUGH, VANCOPEAK, VANCORANDOM, GENTTROUGH, GENTPEAK, GENTRANDOM, TOBRATROUGH, TOBRAPEAK, TOBRARND, AMIKACINPEAK, AMIKACINTROU, AMIKACIN in the last 72 hours.   Microbiology: No results found for this or any previous visit (from the past 720 hour(s)).  Medical History: No past medical history on file.   Assessment: 40 YOM with no known history of drug use who presented to [facility] on 12/30 after being found down for unknown amount of time. Found to have multiple strokes- sent here for management. Has been febrile up to 103. CT today showed large infarction with 6mm midline shift- was started on 3% saline and mannitol for impending herniation. tox screen from 12/30 positive only for benzos. SCr 0.71 from [facility] this morning- estimated CrCl >134mL/min. From paper chart sent with patient, last dose of Zosyn 3.375g given this afternoon at 1300. Patient had a vancomycin trough drawn this morning ~1130 which resulted low at 41mcg/mL- increased to 1g q6h from 1g q8h.   12/30 BCx: NGTD 1/1 CSF: NGTD  Goal of Therapy:  Vancomycin trough level 15-20 mcg/ml  Plan:  -Zosyn 3.375g IV q8h -Vancomycin  IV x1 (ordered prior to trough information obtained) then  IV q6h -follow c/s, clinical progression, GOC, renal function, trough at SS   Lucas Exline D. Jaylina Ramdass, PharmD, BCPS Clinical Pharmacist Pager: [phone] 03/30/2014 8:34 PM

## 2014-03-06 NOTE — Progress Notes (Signed)
eLink Physician-Brief Progress Note Patient Name: Terry [name]. DOB: May 08, 1980 MRN: [id number]   Date of Service  03/18/2014  HPI/Events of Note  Pt with pending herniation and cerebral edema  eICU Interventions  Hyperventilating with goal CO2 of 20-30, will wean rate once sodium ~150.  Continue 3% sodium.  Continue mannitol q6 following osmolality.  Hold mannitol for serum osm > 320.         Sandi Carne K. 03/22/2014, 9:34 PM

## 2014-03-06 NOTE — Consult Note (Signed)
Referring Physician: Wilford Sports    Chief Complaint: Transfer from [facility] with fever and multiple bilateral strokes.  HPI: Terry [name]. is an 34 y.o. male with no known history of an illicit drug use, transferred from [facility] for management of multiple strokes and increasing intracranial pressure with impending uncal herniation. He was admitted there on 03/03/2014 after being found unconscious an unknown time. He was [facility] prison. He was last known well on 03/02/2014. Initial CT scan showed bilateral cerebral infarctions involving a large area of left MCA territory, as well as right PCA stroke. Patient has been febrile with temperature up to 103 Fahrenheit. Repeat CT scan of his head today showed a large area of infarction involving MCA territory with mass effect 6 mm midline shift, in addition to signs of uncal herniation with brainstem compression and transfauxial herniation with early hydrocephalus. He was febrile on arrival with temperature 101.2. He was intubated on admission [facility] and sedated with Versed and fentanyl on arrival here. Urine screen was positive for benzodiazepines on initial admission. He was admitted with status epilepticus just treated with Ativan and upper. He was also given a loading dose of phenobarbital and is treated with Dilantin as well. Subsequent EEG study showed no ongoing seizure activity. Findings were consistent with nonspecific encephalopathic state. 3% IV saline has been ordered for management of cerebral edema, in addition to receiving mannitol IV. Neurosurgery was consulted and intervention was not recommended.  LSN: 03/02/2014 tPA Given: No: Beyond time window for treatment consideration mRankin:  Past medical history: Unavailable  Family history: Unavailable  Social history: Unavailable other than history of illicit drug use.  Medications: I have reviewed the patient's current medications.  ROS: Unavailable  Physical  Examination: Blood pressure 158/98, pulse 135, temperature 101.2 F (38.4 C), temperature source Oral, resp. rate 26, SpO2 100 %. Appearance was that of a young male of slender build who was intubated and on mechanical ventilation.  Neurologic Examination: Left pupil was larger than the right and did not react to light. Right pupil reacted sluggishly to light. Extraocular movements were absent with oculocephalic maneuvers. Face was symmetrical with no clear focal weakness. Muscle tone was flaccid throughout. Patient had minimal movements of left extremities with noxious stimuli. Deep tendon reflexes were 2+ and brisk throughout, and symmetrical. Plantar responses were extensor bilaterally.  Portable Chest Xray  03/21/2014   CLINICAL DATA:  Acute stroke Intubation.  EXAM: PORTABLE CHEST - 1 VIEW 7:05 p.m.  COMPARISON:  03/22/2014 at 5:15 p.m.  FINDINGS: Endotracheal tube tip is 6.5 cm above the carina. NG tube tip is below the diaphragm. PICC tip is 9 cm below the carina and could be retracted at least 4 cm.  Heart size and vascularity are normal.  Lungs are clear.  IMPRESSION: PICC tip is in the right atrium and could be retracted 4 cm. Critical Value/emergent results were called by telephone at the time of interpretation on 03/10/2014 at 7:29 pm to a nurse in the neurointensive care unit who verbally acknowledged these results.   Electronically Signed   By: Geanie Cooley M.D.   On: 03/08/2014 19:29    Assessment: 33 y.o. male 34 year old man with multiple acute bilateral cerebral infarctions, most likely embolic in etiology. Given his febrile state, septic emboli with possible SBE as source cannot be ruled out. He presented initially with status epilepticus. Seizures are controlled at this point.  Stroke Risk Factors - Unknown  Plan: 1. Anticoagulation with IV heparin  for management of likely multiple infarction secondary to septic emboli from some source, likely cardiac. 2. MRI, MRA  of the  brain without contrast 3. PT consult, OT consult, Speech consult 4. Echocardiogram with TEE, if not previously obtained 5. Carotid dopplers 6. Prophylactic therapy-Anticoagulation: IV heparin drip, low-dose 7. HgbA1c, fasting lipid panel 8. Seizure management with Keppra 500 mg every 12 hours and Dilantin 100 mg every 8 hours  We will continue to follow this patient with you area   C.R. Roseanne Reno, MD [facility] [phone]  03/16/2014, 7:56 PM

## 2014-03-06 NOTE — Consult Note (Signed)
Reason for Consult: Multiple CVAs Referring Physician: Critical-care medicine  Terry Purl. is an 34 y.o. male.  HPI: [facility] patient is felt to have infectious etiology to the strokes strokes were dense on the dominant hemisphere on the left left MCA and multiple strokes on the right as well patient was transferred for neurology evaluation and also surgical consult.  No past medical history on file.  No past surgical history on file.  No family history on file.  Social History:  has no tobacco, alcohol, and drug history on file.  Allergies: No Known Allergies  Medications: I have reviewed the patient's current medications.  Results for orders placed or performed during the hospital encounter of 03/08/2014 (from the past 48 hour(s))  Blood gas, arterial     Status: Abnormal   Collection Time: 03/22/2014  6:56 PM  Result Value Ref Range   FIO2 0.30 %   Delivery systems VENTILATOR    Mode PRESSURE REGULATED VOLUME CONTROL    VT 450 mL   Rate 20 resp/min   Peep/cpap 5.0 cm H20   pH, Arterial 7.378 7.350 - 7.450   pCO2 arterial 46.6 (H) 35.0 - 45.0 mmHg   pO2, Arterial 141.0 (H) 80.0 - 100.0 mmHg   Bicarbonate 26.3 (H) 20.0 - 24.0 mEq/L   TCO2 27.6 0 - 100 mmol/L   Acid-Base Excess 2.0 0.0 - 2.0 mmol/L   O2 Saturation 98.9 %   Patient temperature 101.2    Collection site LEFT RADIAL    Drawn by [id number]    Sample type ARTERIAL DRAW    Allens test (pass/fail) PASS PASS    Portable Chest Xray  03/15/2014   CLINICAL DATA:  Acute stroke Intubation.  EXAM: PORTABLE CHEST - 1 VIEW 7:05 [name].m.  COMPARISON:  03/19/2014 at 5:15 [name].m.  FINDINGS: Endotracheal tube tip is 6.5 cm above the carina. NG tube tip is below the diaphragm. PICC tip is 9 cm below the carina and could be retracted at least 4 cm.  Heart size and vascularity are normal.  Lungs are clear.  IMPRESSION: PICC tip is in the right atrium and could be  retracted 4 cm. Critical Value/emergent results were called by telephone at the time of interpretation on 04/02/2014 at 7:29 pm to a nurse in the neurointensive care unit who verbally acknowledged these results.   Electronically Signed   By: Geanie Cooley M.D.   On: 03/25/2014 19:29    Review of Systems  Unable to perform ROS  Blood pressure 158/98, pulse 135, temperature 101.2 F (38.4 C), temperature source Oral, resp. rate 26, SpO2 100 %. Physical Exam  Neurological: He is unresponsive. GCS eye subscore is 1. GCS verbal subscore is 1. GCS motor subscore is 4.  Left pupil fixed nonreactive right appears be 3 -2 has bilateral corneals some nonpurposeful withdrawal left side plegic on the right    Assessment/Plan: 34 year old general with multiple bilateral nonhemorrhagic CVAs. Large MCA distribution stroke on the left probable M1 occlusion there is no role for decompressive hemicraniectomy in the setting patient's current exam shows minimal function on the dominant hemisphere. I discussed this with neurology and we are in agreement  Terry [name],Terry [name] 03/30/2014, 7:33 PM

## 2014-03-06 NOTE — H&P (Addendum)
PULMONARY / CRITICAL CARE MEDICINE HISTORY AND PHYSICAL EXAMINATION   Name: Terry [name]. MRN: [id number] DOB: 05/08/1980    ADMISSION DATE:  Mar 30, 2014  PRIMARY SERVICE: PCCM  CHIEF COMPLAINT:  Extensive CVA and Herniation  BRIEF PATIENT DESCRIPTION: 34 M Transferred from [facility] for possible surgical intervention following massive diffuse CVAs of unclear etiology. Neurosurgery and neurology have evaluated and determined that he is not a surgical candidate and have asked PCCM to admit. Prognosis is grim, family is aware and is weighing options.   SIGNIFICANT EVENTS / STUDIES:  Last seen normal 12/29 Admitted to [facility] on 12/30 with AMS, found to have large bilateraly CVAs and was in status epiliepticus  Intubation 12/30, loaded with Keppra and phenobarbital 03/05/2014 LP with 356 WBCs, 85% N, Pro 72, Glu 86, culture negative 03/30/14 CT demonstrates progression on CVAs and severe herniation  LINES / TUBES: PICC placed at [facility] PIV Art Line 1/1 ETT 12/30  CULTURES: NGTD  ANTIBIOTICS: Vanc/Zosyn at [facility]  HISTORY OF PRESENT ILLNESS:  Terry [name] is a 34 yo M with a history of polysubstance abuse and schizophrenia who was brought to [facility] on 12/30 after being found down in jail for an unknown amount of time. He was last seen normal on 12/29. He was diagnosed with multifocal stroke of unclear etiology and monitored. On 1/2 a CT demonstrated progression of the stroke with herniation and he was transferred to [facility] for neurosurgical evaluation.   PAST MEDICAL HISTORY :  Past Medical History  Diagnosis Date  . Schizophrenia   . Polysubstance abuse    History reviewed. No pertinent past surgical history. Prior to Admission medications   Medication Sig Start Date End Date Taking? Authorizing Provider  bacitracin ointment Apply 1 application topically 2 (two) times daily. 01/28/13  Yes Historical Provider, MD  docusate sodium (STOOL SOFTENER) 100 MG capsule Take 100 mg by mouth 2 (two) times  daily as needed. 01/28/13  Yes Historical Provider, MD  oxyCODONE (OXY IR/ROXICODONE) 5 MG immediate release tablet Take 5-10 mg by mouth every 4 (four) hours as needed for moderate pain.  01/28/13  Yes Historical Provider, MD  sodium chloride (OCEAN) 0.65 % nasal spray Place 1 spray into the nose 3 (three) times daily. For dryness 01/28/13  Yes Historical Provider, MD  haloperidol decanoate (HALDOL DECANOATE) 100 MG/ML injection Inject 50 mg into the muscle every 28 (twenty-eight) days.    Historical Provider, MD   No Known Allergies  FAMILY HISTORY:  History reviewed. No pertinent family history. SOCIAL HISTORY:  has no tobacco, alcohol, and drug history on file.  REVIEW OF SYSTEMS:  Unable to obtain secondary to patient condition.   SUBJECTIVE:   VITAL SIGNS: Temp:  [98.1 F (36.7 C)-101.2 F (38.4 C)] 98.1 F (36.7 C) (01/02 2000) Pulse Rate:  [122-149] 122 (01/02 2100) Resp:  [21-26] 26 (01/02 2100) BP: (144-185)/(81-98) 144/81 mmHg (01/02 2100) SpO2:  [98 %-100 %] 100 % (01/02 2100) FiO2 (%):  [30 %] 30 % (01/02 1910) Weight:  [132 lb 4.4 oz (60 kg)] 132 lb 4.4 oz (60 kg) (01/02 1915) HEMODYNAMICS:   VENTILATOR SETTINGS: Vent Mode:  [-] PRVC FiO2 (%):  [30 %] 30 % Set Rate:  [26 bmp] 26 bmp Vt Set:  [580 mL] 580 mL PEEP:  [5 cmH20] 5 cmH20 Plateau Pressure:  [18 cmH20] 18 cmH20 INTAKE / OUTPUT: Intake/Output      01/02 0701 - 01/03 0700   I.V. (mL/kg) 26 (0.4)   Total Intake(mL/kg) 26 (  0.4)   Urine (mL/kg/hr) 850   Total Output 850   Net -824         PHYSICAL EXAMINATION: General:  Sedated and mechanically ventilated Neuro:  L pupil fixed and dilated. Sedated HEENT:  Sclera anicteric, conjunctiva pink, MMM, ETT present Neck: Trachea supple and midline, (-) LAN or JVD Cardiovascular:  Tachy, RR, NS1/S2, (-) MRG Lungs:  CTAB Abdomen:  S/NT/ND/(+)BS Musculoskeletal:  (-) C/C/E Skin:  Intact  LABS:  CBC  Recent Labs Lab 03/25/2014 1840  WBC 5.6  HGB  10.4*  HCT 32.3*  PLT 129*   Coag's  Recent Labs Lab 04/04/2014 1840  APTT 32  INR 1.21   BMET  Recent Labs Lab 03/14/2014 1840 03/15/2014 1916  NA 140 141  K 3.8  --   CL 106  --   CO2 26  --   BUN 11  --   CREATININE 0.57  --   GLUCOSE 178*  --    Electrolytes  Recent Labs Lab 03/23/2014 1840  CALCIUM 9.0   Sepsis Markers No results for input(s): LATICACIDVEN, PROCALCITON, O2SATVEN in the last 168 hours. ABG  Recent Labs Lab 03/05/2014 1856 04/01/2014 2119  PHART 7.378 7.598*  PCO2ART 46.6* 25.4*  PO2ART 141.0* 154.0*   Liver Enzymes  Recent Labs Lab 03/05/2014 1840  AST 20  ALT 16  ALKPHOS 70  BILITOT 0.6  ALBUMIN 2.6*   Cardiac Enzymes No results for input(s): TROPONINI, PROBNP in the last 168 hours. Glucose  Recent Labs Lab 03/08/2014 1947 03/15/2014 2057  GLUCAP 163* 124*    Imaging Portable Chest Xray  03/25/2014   CLINICAL DATA:  Acute stroke Intubation.  EXAM: PORTABLE CHEST - 1 VIEW 7:05 p.m.  COMPARISON:  03/05/2014 at 5:15 p.m.  FINDINGS: Endotracheal tube tip is 6.5 cm above the carina. NG tube tip is below the diaphragm. PICC tip is 9 cm below the carina and could be retracted at least 4 cm.  Heart size and vascularity are normal.  Lungs are clear.  IMPRESSION: PICC tip is in the right atrium and could be retracted 4 cm. Critical Value/emergent results were called by telephone at the time of interpretation on 03/17/2014 at 7:29 pm to a nurse in the neurointensive care unit who verbally acknowledged these results.   Electronically Signed   By: Geanie Cooley M.D.   On: 03/06/2014 19:29    CXR: Personally reviewed. PICC tip deep. Otherwise WNL.  ASSESSMENT / PLAN:  Active Problems:   Ischemic stroke   Embolic stroke involving left middle cerebral artery   Embolic stroke involving right posterior cerebral artery   Generalized seizures   Cerebral edema  1. Neurological Devastation Secondary to Multiple CVAs and Herniation: The underlying cause of  the CVAs is unclear to me, infection was suspected but has not yet been demonstrated. Regardless, I have spoken to our Neurosurgical and Neurology colleagues, both of whom think this is a devastating injury and that the prognosis for recovery is very poor. I discussed their concerns with the family in detail, they are saddened but understand. They are planning to withdraw support when a few more family members arrive. We will continue supporting him until they are assembled and ready to withdraw support.   TODAY'S SUMMARY:   I have personally obtained a history, examined the patient, evaluated laboratory and imaging results, formulated the assessment and plan and placed orders.  CRITICAL CARE: The patient is critically ill with multiple organ systems failure and requires high complexity decision  making for assessment and support, frequent evaluation and titration of therapies, application of advanced monitoring technologies and extensive interpretation of multiple databases. Critical Care Time devoted to patient care services described in this note is 90 minutes.   Evalyn Casco, MD Pulmonary and Critical Care Medicine [facility] Pager: [phone]   03/28/2014, 10:56 PM

## 2014-03-07 LAB — GLUCOSE, CAPILLARY: GLUCOSE-CAPILLARY: 126 mg/dL — AB (ref 70–99)

## 2014-03-07 LAB — URINE CULTURE

## 2014-03-07 LAB — OSMOLALITY: OSMOLALITY: 310 mosm/kg — AB (ref 275–300)

## 2014-03-08 LAB — CULTURE, BLOOD (SINGLE)

## 2014-03-08 NOTE — Progress Notes (Signed)
UR completed-retro.  Carlyle Lipa, RN BSN MHA CCM Trauma/Neuro ICU Case Manager [phone]

## 2014-03-09 LAB — CSF CULTURE

## 2014-03-10 LAB — CULTURE, BLOOD (SINGLE)

## 2014-04-04 NOTE — Discharge Summary (Signed)
DISCHARGE SUMMARY    Date of admit: 03/17/2014  6:23 PM Date of discharge: 03/14/2014  4:00 AM Length of Stay: 1 days  PCP is No primary care provider on file.   PROBLEM LIST Active Problems:   Ischemic stroke   Embolic stroke involving left middle cerebral artery   Embolic stroke involving right posterior cerebral artery   Generalized seizures   Cerebral edema    SUMMARY Terry PurlGary A Flood Jr. was 34 y.o. patient with    has a past medical history of Schizophrenia and Polysubstance abuse.   has no past surgical history on file.   Admitted on 03/09/2014 with   Terry [name] is a 34 yo M with a history of polysubstance abuse and schizophrenia who [facility] after being found down in jail for an unknown amount of time. He was last seen normal on 12/29. He was diagnosed with multifocal stroke of unclear etiology and monitored. On 1/2 a CT demonstrated progression of the stroke with herniation and [facility]. Neurologic consultation assessed this is a devastating neurologic injury. After discussion with family patient terminally weaned and then expired 03/31/2014   Note: I  Dr Marchelle Gearingamaswamy am only transcribing a death summary. I never evaluated this patient    SIGNED Dr. Kalman ShanMurali Giovani Neumeister, M.D., [facility] Center[facility].C.C.P Pulmonary and Critical Care Medicine Staff Physician [facility] Pulmonary and Critical Care Pager: [phone], If no answer or between  15:00h - 7:00h: call [phone]  04/04/2014 7:44 PM

## 2014-04-05 NOTE — Procedures (Signed)
Extubation Procedure Note  Patient Details:   Name: Terry [name]. DOB: 1980-09-25 MRN: [id number]   Airway Documentation:     Evaluation  O2 sats: currently acceptable Complications: No apparent complications Patient did tolerate procedure well. Bilateral Breath Sounds: Rhonchi Suctioning: Oral, Airway No  Patient extubated per Protocol for Withdrawal of Life  Erskine Speed 03-27-14, 12:45 AM

## 2014-04-05 NOTE — Progress Notes (Signed)
Asystole showed on monitor. Patient pronounced by Rudi Coco at 0400. Witnessed by Alphia Moh, RN. Family at bedside.

## 2014-04-05 NOTE — Progress Notes (Signed)
Chaplain initiated visit with pt and family when she noticed family gathered in waiting area. Chaplain offered prayer and grief support. Upon arrival chaplain informed that pt passed just minutes earlier. Family appreciative of chaplain support. Chaplain informed family of her support should they need it. Page chaplain as needed.    03/16/2014 0400  Clinical Encounter Type  Visited With Family;Health care provider  Visit Type Spiritual support;Death  Spiritual Encounters  Spiritual Needs Emotional;Grief support;Prayer  Stress Factors  Family Stress Factors Loss  Chonda Baney, Mayer Masker, Chaplain 03/08/2014 4:20 AM

## 2014-04-05 NOTE — Progress Notes (Signed)
Narcotics wasted in sink, fentanyl=110, versed=5, hydromorphone=60 by Konrad Felix RN and witnessed by Jonelle Sidle RN.

## 2014-04-05 NOTE — Progress Notes (Signed)
Wasted the following drips and witnessed by Jonelle Sidle, RN (with Konrad Felix, RN) Dilaudid- Versed- Fentanyl- .

## 2014-04-05 DEATH — deceased

## 2014-06-22 NOTE — Discharge Summary (Signed)
PATIENT NAME:  Terry Terry [name], Terry Terry [name] DATE OF BIRTH:  Jan 20, 1981  DATE OF ADMISSION:  11/21/2011 DATE OF DISCHARGE:  11/27/2011  HOSPITAL COURSE: See dictated history and physical for details of admission. This 34 year old man with Terry [name] history of paranoid schizophrenia and alcohol abuse was admitted to the hospital after being more belligerent and agitated around his family and his mental health caseworkers. He admitted that he had been drinking some beer. He was expressing some paranoia. In the hospital he was treated with haloperidol which he took and appeared to tolerate well. He did not have very good insight about his psychosis although he was cooperative with treatment. He attended substance abuse groups and for most of his hospital stay expressed the opinion that he was only in the hospital because of alcohol abuse. He had initially wanted to go to the alcohol and drug abuse treatment center. Later in the hospitalization he started to become Terry [name] little bit more irritable at the length of time he was in the hospital and requested discharge. We spoke with his caseworker who was willing to work with him as an outpatient again with his substance abuse and his psychosis. Grandmother expressed Terry [name] willingness to have him come back home again and no concern about acute safety. Terry Terry [name] was not violent although on one or two occasions he did become quite irritable. He was able to control himself and calm his temper down. At the time of discharge he was not angry or threatening. He was able to carry on Terry [name] fairly lucid conversation. At times he still expressed some vague paranoia but did not express any gross or bizarre psychosis. He was treated also with Restoril and Remeron for sleep and mood and tolerated these well. He was discharged back to the company of his outpatient mental health team who will continue to work with him. He was agreeable to the discharge plan.   DISCHARGE MEDICATIONS:  1. Haldol 10  mg p.o. twice Terry [name] day.  2. Restoril 15 mg as needed at bedtime for sleep.  3. Remeron 15 mg at bedtime.   LABORATORY, DIAGNOSTIC AND RADIOLOGICAL DATA: Admission chemistry panel showed only Terry [name] slightly elevated glucose at 103 of no significance. Alcohol level was 14. CBC was normal. Salicylates normal. TSH low at 0.010. Free thyroxine, however, was 1.38. Drug screen was negative. Urinalysis unremarkable.   MENTAL STATUS EXAM AT DISCHARGE: Neatly dressed and groomed man, looks his stated age. Cooperative with the interview. Good eye contact. Normal psychomotor activity. Affect Terry [name] little bit blunted, not hostile, not threatening. Thoughts fairly concrete but not grossly bizarre. Did not make any delusional statements. Denied feeling frightened or angry or paranoid. Denied hallucinations. Denied suicidal or homicidal ideation. Has some chronic impairment in his insight. Judgment appears improved. Intelligence normal.   DIAGNOSIS PRINCIPLE AND PRIMARY:  AXIS I: Schizophrenia, paranoid type.   SECONDARY DIAGNOSES:  AXIS I: Alcohol abuse.   AXIS II: Deferred.   AXIS III: Rule out hyperthyroidism.   AXIS IV: Moderate. Chronic stress from lack of resources, limited social support.   AXIS V: Functioning at time of discharge 56.   ____________________________ Terry AmelJohn T. Clapacs, MD jtc:cms D: 11/29/2011 11:46:20 ET T: 11/30/2011 11:08:31 ET JOB#: [id number]  cc: Terry AmelJohn T. Clapacs, MD, <Dictator> Terry AmelJOHN T CLAPACS MD ELECTRONICALLY SIGNED 11/30/2011 11:28

## 2014-06-22 NOTE — H&P (Signed)
PATIENT NAME:  Terry [name], Terry [name] DATE OF BIRTH:  Jul 26, 1980  DATE OF ADMISSION:  11/21/2011  CHIEF COMPLAINT: "I told the lady to get out of my property."  HISTORY OF PRESENT ILLNESS: This is one of several psychiatric hospitalizations for this 34 year old African American male who is admitted from the Emergency Room. The patient carries a long-term history of paranoid schizophrenia as well as cocaine, marijuana, and alcohol abuse. Over the last several days he has run into conflict with his grandmother at home particularly of leaving the house in the middle of the night and the grandmother reports he buys alcohol at that time. He admits to drinking some alcohol but reports he gets up and leaves in order to go get cigarettes. In addition, over the last 2 or 3 days, there have been marked conflicts with his caseworker at mental health who has taken out commitment papers two days in a row. More recently he became quite angry with her on the outside. The patient denies any recent drug use or abuse. He does report drinking a 24 ounce beer per day. The patient currently is denying to me paranoid symptoms, but is somewhat irritable in the interview. He admits to in the past marked paranoia feeling people are trying to poison him, thought insertion, auditory hallucinations, and somatic delusions, but he denies that more recently. However, he makes some paranoid statements about people are out to get him in the interview as I talk with him without firm delusions. The patient has been on Risperidone in the past. He has been on Haldol 10 mg at bedtime and Remeron 15 mg at bedtime for at least the last year and has not had any hospitalizations in over a year so that has been relatively holding the patient.   FAMILY HISTORY: Mother has "nerve trouble". Some other family members have nerve trouble. Father had alcohol and drug problems.   PAST MEDICAL HISTORY: Urological procedure at age 34, otherwise  negative.   REVIEW OF SYSTEMS: Occasional headaches, otherwise ENT is negative. RESPIRATORY: Negative. CARDIOVASCULAR: Negative. GASTROINTESTINAL: Negative. GENITOURINARY: Negative. MUSCULOSKELETAL: Negative. NEUROLOGICAL: Negative.   MEDICATIONS: The patient takes no other medications, except that described above.   SOCIAL HISTORY: The patient has a high school education. 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 He is currently on SSI. He lives with his grandmother in a house and is followed by mental health and ACT Team. Currently his grandmother does not want him back in the house because she is afraid of him. He has never been married, no children, and no current girlfriend. He smokes a pack of cigarettes per day. The patient denies doing anything for fun.   PHYSICAL EXAMINATION:   MENTAL STATUS: Examination revealed a black male who looked his stated age. He was slightly irritable but was able to give me some sort of a history. While denying all psychotic symptoms, he did have a few paranoid statements in the interview about people in the world are out to get him, which may be involved with his recent agitation. He was reliable about denying drugs. He has had two negative drug screens recently and did admit to the alcohol. He was well groomed and aware of his surroundings, oriented x4, knew the presidents backwards x2, and remembered three of three objects at 1 and 3 minutes.   HEENT: Head is normocephalic. Pupils are equal, round, and reactive to light and  accommodation. Throat is clear.   NECK: Supple without masses.   CHEST: Clear.   CARDIAC: Normal sinus rhythm with normal S1 and S2 without murmur or gallop. Good carotid and pedal pulses.   ABDOMEN: Soft without tenderness, masses, or organomegaly. Bowel sounds are present. There is no flank tenderness.   EXTREMITIES: No limitation of motion.   NEUROLOGICAL: Cranial nerves  II through XII grossly intact. Good gait with good heel walk, toe walk, and straight line walk. Good finger-to-nose. Vibration and sensory present in lower extremities. Deep tendon reflexes trace and symmetrical with no pathological reflexes.      IMPRESSION:   AXIS I:  1. Paranoid schizophrenia with recent agitation and mild paranoid symptoms.  2. Alcohol abuse.  3. History of cocaine and marijuana abuse.   AXIS II: None.  AXIS III: None.   AXIS IV: Conflict in the home with grandmother.   AXIS V: 30 - The patient is mildly paranoid but irritable and angry.  ASSESSMENT AND PLAN: The patient will be placed back on Remeron and Haldol and may try to attempt to find group home placement for him. We will also use Haldol p.r.n. for agitation.  ____________________________ Venida Jarvis, MD wjr:slb D: 11/21/2011 15:03:22 ET T: 11/21/2011 15:38:43 ET JOB#: [id number]  cc: Venida Jarvis, MD, <Dictator> Jules Husbands MD ELECTRONICALLY SIGNED 11/21/2011 17:22

## 2014-06-22 NOTE — Consult Note (Signed)
Brief Consult Note: Diagnosis: Paranoid schizophrenia, Alcohol abuse.   Patient was seen by consultant.   Consult note dictated.   Recommend further assessment or treatment.   Orders entered.   Discussed with Attending MD.   Comments: [name] became upset with his alcoholic father while drunk himself last night. He is no ,onger drunk, suicidal or homicidal.   PLAN: 1. The patient does not meet criteria for IVC. Please discharge as appropriate.   2. He is to continue all medications as prescribed by Dr. Cherylann [name], his primary psychiatrist. He tells me that he run out of Trazodone. I will provide a Rx.   3. ACT team will pick him up from ER.  4. He will follow up with Dr. Cherylann [name] and ACT Team.  Electronic Signatures: Kristine LineaPucilowska, Morrison Masser (MD)  (Signed 16-Sep-13 11:13)  Authored: Brief Consult Note   Last Updated: 16-Sep-13 11:13 by Kristine LineaPucilowska, Janiel Crisostomo (MD)

## 2014-06-22 NOTE — Consult Note (Signed)
PATIENT NAME:  Terry [name], Terry [name] MR#:  [id number] DATE OF BIRTH:  [birth year]  DATE OF CONSULTATION:  11/19/2011  REFERRING PHYSICIAN:  Dr. Maricela Bo  CONSULTING PHYSICIAN:  Audriana Aldama B. Kinnick Maus, MD  REASON FOR CONSULTATION: To evaluate a suicidal patient.   IDENTIFYING DATA: Terry [name] is a 34 year old male with history of paranoid schizophrenia.   CHIEF COMPLAINT: "I need to stop drinking."   HISTORY OF PRESENT ILLNESS: Terry [name] lives with his grandmother. He has been stable on Haldol prescribed by Dr. Cherylann Ratel of ACT team. He has been doing well until recently when he started drinking alcohol, just for a couple of days drinking beer. On the night of admission his father visited grandmother's house, he got drunk and was very loud and agitated. It scared the patient. He called the police and asked to be brought to the hospital. The patient reports that he drinks very little, only when he has money, 2 or 3 beers. He gets $50 each week. He overdrew his account. He does not want to live in a group home because he will get only $65. He is very conversational and well organized now when the upset of drunken father is over. He has no behavioral problems in the Emergency Room. He denies symptoms of depression, anxiety, or psychosis. There are no symptoms suggestive of bipolar mania. He denies other than alcohol substance use. He denies prescription pill abuse.   PAST PSYCHIATRIC HISTORY: The patient was hospitalized several times here and probably other hospitals as well. He has a history of poor medication compliance and substance abuse. He has a history of coming to the hospital really psychotic and paranoid. He seems to be in much better shape now possibly due to better medication compliance. He denies prior suicide attempts.   FAMILY PSYCHIATRIC HISTORY: Mother with nerve trouble. The father has alcohol and drug problems.   MEDICAL HISTORY: Urological procedure at the age of 18.   ALLERGIES: No  known drug allergies.    MEDICATIONS ON ADMISSION:  1. Haldol 10 mg daily.  2. Trazodone 100 mg at bedtime. 3. Dr. Cherylann Ratel, possibly started Remeron recently.   SOCIAL HISTORY: The patient lives with his grandmother. He has some legal charges to face shortly for second degree trespassing. It is possible that he overdrawn his debit card and he worries that he will be charged. He is disabled and has Medicaid.   REVIEW OF SYSTEMS: CONSTITUTIONAL: No fevers or chills. No weight changes. EYES: No double or blurred vision. ENT: No hearing loss. RESPIRATORY: No shortness of breath or cough. CARDIOVASCULAR: No chest pain or orthopnea. GASTROINTESTINAL: No abdominal pain, nausea, vomiting, or diarrhea. GENITOURINARY: No incontinence or frequency. ENDOCRINE: No heat or cold intolerance. LYMPHATIC: No anemia or easy bruising. INTEGUMENTARY: No acne or rash. MUSCULOSKELETAL: No muscle or joint pain. NEUROLOGIC: No tingling or weakness. PSYCHIATRIC: See history of present illness for details.   PHYSICAL EXAMINATION:  VITAL SIGNS: Blood pressure 133/69, pulse 114, respirations 18, temperature 96.7.   GENERAL: This is a well-developed young male in no acute distress. The rest of the physical examination is deferred to his primary attending.   LABORATORY, DIAGNOSTIC AND RADIOLOGICAL DATA: Chemistries within normal limits except for blood glucose of 103. Blood alcohol level 0.014. LFTs within normal limits. TSH 0.01, free thyroxine 1.37. Urine tox screen negative for substances. CBC within normal limits. Urinalysis is not suggestive of urinary tract infection. Serum acetaminophen and salicylates are low.   MENTAL STATUS EXAMINATION: The patient is alert  and oriented to person, place, time, and situation. He is pleasant, polite, and cooperative. He is well groomed. He wears hospital scrubs. He maintains good eye contact. His speech is of normal rhythm, rate, and volume. Mood is fine with full affect. Thought  processing is logical and goal oriented. Thought content: He denies suicidal or homicidal ideation. There are no delusions or paranoia. There are no auditory or visual hallucinations. His cognition is grossly intact. His insight and judgment are fair.   SUICIDE RISK ASSESSMENT: This is a patient with history of schizophrenia but no mood symptoms or suicide attempts who came to the hospital when upset with his drunken father and unable to handle the situation by himself. He is compliant with medication and works with trusted psychiatrist.   DIAGNOSES:  AXIS I:  1. Paranoid schizophrenia.  2. Alcohol abuse.   AXIS II: Deferred.   AXIS III: None.   AXIS IV: Mental illness, substance abuse, family conflict.   AXIS V: GAF 45.   PLAN: The patient does not meet criteria for involuntary inpatient psychiatric commitment. Please discharge as appropriate. He will continue all his medication as prescribed by Dr. Cherylann RatelLateef, his primary psychiatrist. He tells me that he ran out of trazodone. I will provide a prescription. ACT team will pick him up from the Emergency Room. He will follow up with Dr. Cherylann RatelLateef and ACT team.   ____________________________ Ellin GoodieJolanta B. Ezella Kell, MD jbp:cms D: 11/20/2011 21:22:01 ET T: 11/21/2011 09:27:15 ET JOB#: [id number]  cc: Juliet Vasbinder B. Jennet MaduroPucilowska, MD, <Dictator>  Shari ProwsJOLANTA B Lyonel Morejon MD ELECTRONICALLY SIGNED 11/22/2011 3:40

## 2014-06-26 NOTE — Consult Note (Signed)
PATIENT NAME:  Terry ProwsSELLARS, Giankarlo A MR#:  [id number] DATE OF BIRTH:  1980/07/11  DATE OF CONSULTATION:  10/11/2013  REFERRING PHYSICIAN:    CONSULTING PHYSICIAN:  Harlo Fabela K. Guss Bundehalla, MD  PLACE [facility] DICTATION:  [facility], [address], [place], [place].  SUBJECTIVE:  Patient was seen in consultation in the [facility] [address] [address], [place], [place].    HISTORY OF PRESENT ILLNESS:  Patient is a 34 year old African American male not employed, and last worked "can't remember."  Patient is single, and admits that he is homeless for the past 4 days. Prior to 4 days, he has been living here and there, anywhere he could.  Patient has a long history of mental illness, and has a diagnosis of schizoaffective disorder, and is [facility] by Dr. Cherylann RatelLateef and the ACT Team comes and visits him often, and takes care of his medications.  Patient has a charge pending, and has court date pending tomorrow, that is 10/12/2013, but patient stated that the charge is taken care, and they told him that he has to pay a fine of 210 dollars, and when he was asked the reason for the charge, he reported, "I don't know."  According to information obtained from the chart, patient tried to overdose himself on Trazodone which was prescribed, and he was brought here for help. Patient reports that he did not know why he did it, and he cannot give the reasons, he was frustrated and upset.      ALCOHOL AND DRUGS:  Admits he drinks alcohol at the rate of 2 beers per day, and not on every day basis, he has 1 DWI but cannot remember when, never in a state of public drunkenness.  He admits that he smokes THC, and cocaine sometimes but not on every day basis.   MENTAL STATUS:  Patient is alert and oriented to place, person, and time, calm, pleasant and cooperative, no agitation.  However, he denies hearing voices saying things, denies paranoia or suspicious ideas, denies thought control. Denies any ideas or plans to hurt  himself or others.  Insight and judgment fair.  He is eager to go home, and will keep up his followup appointment with ACT Team, and take the medications as prescribed, and contracts for safety.   IMPRESSION:  Schizoaffective disorder, currently he is stable, and patient denies any ideas, or plans to hurt himself or others.   RECOMMEND:  Discharge the patient, IVC papers have been discontinued, and he will be discharged home, and he will keep up his followup appointment with ACT Team with [facility].     ____________________________ Jannet MantisSurya K. Guss Bundehalla, MD skc:nt D: 10/11/2013 17:50:58 ET T: 10/11/2013 18:04:12 ET JOB#: [id number]  cc: Monika SalkSurya K. Guss Bundehalla, MD, <Dictator> Beau FannySURYA K Norinne Jeane MD ELECTRONICALLY SIGNED 10/17/2013 17:15

## 2014-06-30 NOTE — H&P (Signed)
PATIENT NAME:  Carlyon ProwsSELLARS, Gasper A MR#:  [id number] DATE OF BIRTH:  1980-05-02  DATE OF ADMISSION:  03/03/2014  REFERRING PHYSICIAN: Enedina Finnerandolph N. Manson PasseyBrown, MD  PRIMARY CARE PHYSICIAN: Not known at this time.   CHIEF COMPLAINT: Altered mental status since yesterday evening.   HISTORY OF PRESENT ILLNESS: The patient is a [age] year old African American male, a resident of [facility], was brought by Tax advisersecurity officers with a history of being found unresponsive since last night. The patient was brought to the Emergency Room in an unresponsive condition, and upon arrival, the patient was evaluated by the ED physician and was noted to be in a possible postictal state. Subsequently, the patient continued to have generalized tonic-clonic seizures, which persisted all through his stay in the Emergency Room and required multiple doses of IV Ativan and a loading dose of Keppra. Despite all these efforts, he continued to seize; hence, he was intubated by the ED physician in the Emergency Room for airway protection  and was put on Versed. He continued to have seizures and he also received phenobarbital, loading dosage. He was maintained on mechanical ventilation.   Further workup done in the Emergency Room came. His blood work was normal and his CT scan of the head, noncontrast study, was significant for diffuse bilateral infarcts- acute versus subacute cerebral infarcts of possible cardiac embolic nature.   The patient is deeply sedated and on mechanical mentation at the current time. He is completely under the effects of sedation. No history is available. The history I got is from the ER physician. There is no accompanying attendant also with the patient, except for the security officers from the jail, and they do not know much about the medical history. According to ED physician, the patient has a history of schizophrenia, for which he takes Haldol monthly.   The patient has been in prison since 02/07/2014.    At the current time, the patient is deeply sedated, intubated and on ventilatory support. Seizures have subsided. The ED physician contacted the neurologist on-call who advised to continue with the mechanical ventilation and they will follow up further in the morning. Blood cultures were obtained and the patient was started on broad spectrum antibiotics of vancomycin and Zosyn to cover for possible infective endocarditis and cardiac source of septic emboli.    PAST MEDICAL HISTORY:  1.  Schizophrenia.  2.  History of alcohol and substance abuse.    PAST SURGICAL HISTORY: Unable to be obtained since the patient is intubated and no attendant is available.   HOME MEDICATIONS: Haldol q. monthly.    ALLERGIES: No known drug allergies.   FAMILY HISTORY: Unobtainable since the patient is sedated and intubated, and no family member is available at this time.    SOCIAL HISTORY: The patient is a resident of [facility] and he has been in the [facility] since 02/07/2014. Further details are not known at this time since the patient is intubated, and no attendant is available. As per the ER physicians, the patient has a history of alcohol and substance abuse.    REVIEW OF SYSTEMS: Not able to provide since the patient is deeply sedated, intubated and on mechanical ventilation.   PHYSICAL EXAMINATION:  GENERAL: The patient is unresponsive, orally intubated, sedated and on ventilatory support.  Well-developed, well-nourished. Critically ill appearing. Detailed examination is not possible at this time.  VITAL SIGNS: Temperature 97. 7 degrees Fahrenheit,  pulse rate 108 per minute, respirations 18 per minute, blood  pressure 113/101. Current O2 oxygen saturations on ventilator support are 100%.   HEAD, EYES, EARS, NOSE, AND THROAT: The patient is orally intubated. Detailed examination is not possible.  NECK: Supple.  RESPIRATORY: Bilateral air entry present. On vent  support. CARDIOVASCULAR: S1, S2 regular. No murmurs appreciated.  GASTROINTESTINAL: Abdomen soft and nontender. Bowel sounds present and equal in all 4 quadrants.  MUSCULOSKELETAL: Not able to able to perform since the patient is deeply sedated, but according to the ED staff, the patient was moving all 4 extremities.  SKIN: Inspection within normal limits.  LYMPHATIC: No cervical lymphadenopathy. VASCULAR: Good dorsalis pedis and posterior tibial pulses.  NEUROLOGICAL: The patient is deeply sedated. Unable to do neurological examination.  PSYCHIATRIC: Unable to a psychiatric examination since the patient is deeply sedated and intubated.   ANCILLARY DATA: Laboratory Data: Serum glucose 157, BUN 20, creatinine 0.98, sodium 140, potassium 4.1, chloride 104, bicarbonate 24, total calcium 9.8, total protein 8.1, serum albumin 3.9, total bilirubin 0.6, alkaline phosphatase 136, AST 38, ALT 34, total CK 113, CK-MB 1.0. Troponin less than 0.02. Urine Drug Screen: Benzodiazepine positive. CBC: WBC 8.1, hemoglobin 14.9, hematocrit 45.1, platelet count 184,000. Urinalysis: WBC 1, bacteria trace.   IMAGING STUDIES:  CHEST X-RAY: Endotracheal and oral gastric tubes are in good position. No evidence of intrathoracic disease.  CT OF THE HEAD, NONCONTRAST STUDY: Acute and subacute bilateral cerebral infarcts, and the pattern suggests cardiac emboli. No intracranial hemorrhage.   ELECTROCARDIOGRAM: Sinus tachycardia with ventricular rate of 161 beats per minute. No acute ST-T changes.   ASSESSMENT AND PLAN: The patient is a 34 year old African American male, resident of [address], was brought by the prison security persons with the complaint of altered mental status since last evening.   1.  Acute encephalopathy, etiology not known at this time. No attendant is available with the patient at this time, as per the prison authorities. According to the ER physicians. the patient has a history of  schizoaffective disorder, for which he was on Haldol. Will rule out infectious causes, rule out acute CNS causes, rule out toxicology causes.   2.  Status epilepticus requiring multiple medications, despite which he continued to have status epilepticus and required intubation and ventilatory support at the time. The patient received loading doses of Keppra and phenobarbital, and currently the seizures have ceased. Plan is to admit to the CCU. Continue ventilatory support. Follow up ABGs. Critical care consult requested for vent management/assistance. Neurology consultation requested.  2.  Diffuse bilateral cerebral subacute and acute infarcts, suggestive of possible cardiac embolic source. Plan is for supportive care, ventilatory support and neurological consultation for further assistance. 3.  Possible septic emboli. Past medical history is not known much. Plan is for obtaining blood cultures, starting empiric antibiotics IV vancomycin and Zosyn, order 2 D echo and following up accordingly.  4.  History of schizophrenia, on monthly Haldol. Monitor for now.  5.  History of alcohol and substance abuse. Urine drug screen is positive for benzodiazepines at this time; otherwise negative.  6.  Deep vein thrombosis prophylaxis with subcutaneous Lovenox.  7.  Gastrointestinal prophylaxis with Protonix.   CODE STATUS: Full Code.   CONDITION: The patient is critically ill.  TIME SPENT: 55 minutes.    ____________________________ Crissie Figures, MD enr:MT D: 03/03/2014 07:58:36 ET T: 03/03/2014 08:22:39 ET JOB#: [id number]  cc: Crissie Figures, MD, <Dictator> Crissie Figures MD ELECTRONICALLY SIGNED 03/05/2014 18:51

## 2014-06-30 NOTE — Consult Note (Signed)
PATIENT NAME:  Terry [name], [name] MR#:  [id number] DATE OF BIRTH:  [birth year]  DATE OF CONSULTATION:  03/03/2014  REFERRING PHYSICIAN:   CONSULTING PHYSICIAN:  Pauletta BrownsYuriy Nycole Kawahara, MD  REASON FOR CONSULTATION: Seizure activity.  HISTORY OF PRESENT ILLNESS: A 34 year old gentleman, a resident of [facility], brought to [facility] with generalized tonic-clonic seizures, status post Ativan with minimal relief. The patient was loaded with Keppra. Despite still having seizures, the patient was started on propofol with minimal effect. The patient was intubated for airway protection, started on Versed drip. The patient is sedated with mechanical ventilation with a rate of 20. Currently on Versed and fentanyl drip. The patient is status post CT head, which was reviewed. The patient has multiple bilateral infarcts in posterior and anterior circulation. They are acute to subacute in nature.   REVIEW OF SYSTEMS: Unable to obtain.  PAST MEDICAL HISTORY: Questionable schizophrenia, history of EtOH abuse.   HOME MEDICATIONS: P.r.n. Haldol.   ALLERGIES: No known drug allergies.   FAMILY HISTORY: Unknown.   VITAL SIGNS: On admission, the patient's temperature was 100, currently is 99.4. Pulse 124, respirations 20, blood pressure 105/98, pulse ox 100% intubated.   NEUROLOGICAL EVALUATION: The patient is heavily sedated, not following any commands. Pupils 1 mm, nonreactive. No corneals, weak gag, weak cough. Does not withdraw from any painful stimuli.   IMPRESSION: A 34 year old gentleman, transferred from [facility] with generalized seizure activity. As per family members today, no history of seizures, but the patient does have questionable history of schizophrenia. CAT scan of the head did show multiple bilateral posterior and anterior circulation infarcts. Currently on Versed and fentanyl drip. I have loaded the patient on Dilantin 1 gram, which is about 20 mg/kg, about 58 kg  and ordered a daily Dilantin of 300 mg IV daily. Will obtain Dilantin level in the morning. Keppra was also started 1 gram q. 12. Starting tomorrow, we will start taper off Versed to try to see if the patient wakes up. Based on the CAT scan, I think these strokes are cardioembolic. Would strongly suspect endocarditis as a possibility of the cause.   PLAN: Continue antibiotics. 2-D echo was done, awaiting for official results. If no source found, will strongly encourage obtaining transesophageal echocardiogram and have cardiology on board. Continue vancomycin and Zosyn until cultures all come back negative. Thank you. It was a pleasure seeing this patient. Please call with any questions    ____________________________ Pauletta BrownsYuriy Machell Wirthlin, MD yz:mw D: 03/03/2014 13:24:43 ET T: 03/03/2014 14:02:04 ET JOB#: [id number]  cc: Pauletta BrownsYuriy Chonda Baney, MD, <Dictator> Pauletta BrownsYURIY Thao Vanover MD ELECTRONICALLY SIGNED 03/23/2014 21:08

## 2014-06-30 NOTE — Consult Note (Signed)
PATIENT NAME:  Terry [name], Terry [name] MR#:  [id number] DATE OF BIRTH:  1980-05-01  DATE OF CONSULTATION:  03/15/2014  REFERRING PHYSICIAN:  Dr.Klein CONSULTING PHYSICIAN:  Cheral Marker. Ola Spurr, MD  REASON FOR CONSULTATION: Meningitis and altered mental status.   HISTORY OF PRESENT ILLNESS: This is a 34 year old gentleman with history of polysubstance abuse, including cocaine and marijuana, as well as schizophrenia or schizoaffective disorder, who was in prison since December 6 apparently. He was brought in after being found unresponsive at prison. He apparently had a seizure with generalized tonic-clonic movements. He was noted with Ativan and Keppra and intubated for airway protection. He has been seen by neurology. CT scan showed multiple bilateral infarcts consistent with embolic phenomena. Since admission, he has been on vancomycin and Zosyn. Lumbar puncture was done that showed over 300 white cells, mainly neutrophilic. A TTE has been done which did not show any evidence of vegetations on his valve.   PAST MEDICAL HISTORY: 1.  Schizophrenia.  2.  History of alcohol, cocaine, and marijuana use. Apparently, I find no record of injection drug use.    PAST SURGICAL HISTORY: Unknown.    FAMILY HISTORY: Noncontributory.   ANTIBIOTICS SINCE ADMISSION: Vancomycin and Zosyn.   ALLERGIES: He has no known drug allergies.   REVIEW OF SYSTEMS: Unable to be obtained.   PHYSICAL EXAMINATION: VITAL SIGNS: Temperature 99.1, pulse 112, blood pressure 158/78, respirations 20, saturation 100% on ventilator. T-max was 102.4 on January 1. He has been febrile since admission to 102.3.  GENERAL: He is thin. He is intubated, has an OG tube in place. He is calm.  HEENT: His left pupil is dilated and irregular. The right pupil is constricted.   NECK: Supple.  HEART: Regular.  LUNGS: Clear.  ABDOMEN: Soft, nontender, nondistended. No hepatosplenomegaly.  EXTREMITIES: No clubbing, cyanosis or edema.  SKIN: No  lesions. No evidence of skin popping or track marks.  NEUROLOGIC: He is sedated. Pupils as above. He has hyperreflexia in his lower extremities, as well as clonus, more pronounced on the left lower extremity.   DATA: White blood count on admission was 8.1, currently is 8.1, hemoglobin 11, platelets 109,000. ESR 63. Urine toxicology screen was positive for benzodiazepines, but he had been receiving Ativan for seizures. Dilantin has been loaded. Troponins were borderline positive. Renal function shows a creatinine of 0.71. LFTs showed protein low at 5.4, hemoglobin 2.5, bilirubin 1.1. Other LFTs are normal. Blood cultures December 30, two-of-two are negative, January 1 two-of-two are negative. CSF culture reveals no growth to date from January 1. Urine culture January 1 negative. CSF on January 1 showed 356 white cells, 83 red cells. The differential is 85% neutrophils, 8% lymphocytes, 7% monocytes, total protein is elevated at 72, glucose is elevated at 86.   IMAGING: Chest x-ray done December 30 showed no evidence of cardiopulmonary disease. CT of the head December 30 showed acute and subacute bilateral cerebral infarcts suggesting cardiogenic emboli, no internal hemorrhage. Followup chest x-ray January 1 showed no acute cardiopulmonary disease. Echocardiogram done December 30 showed EF 50% to 55%. Valves were normal. No evidence of vegetations.   IMPRESSION: A 34 year old gentleman with history of polysubstance abuse, schizophrenia, admitted from prison with seizures, found to have multiple bilateral strokes consistent with embolic phenomena. He has had fevers persistently but blood cultures are negative. 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   This is a confusing and difficult case as there is no  obvious source of infection, but I do think endocarditis or an endovascular infection is the most likely etiology at this point. I am concerned  currently because his left pupil is quite dilated and nonreactive. HIV and RPR, as well as EBV and HSV testing is pending. Autoimmune workup with ANA is also pending.   RECOMMENDATIONS: 1.  Continue vancomycin.  2.  Change Zosyn to meropenem for slightly better CNS penetration.  3.  Agree with CAT scan per neurology.  4.  Consider TEE. It can sometimes take several days for vegetations to appear in the setting of acute bacterial endocarditis.   Thank you for the consultation. I would be glad to follow with you.    ____________________________ Cheral Marker. Ola Spurr, MD dpf:at D: 03/24/2014 13:20:32 ET T: 03/11/2014 13:54:08 ET JOB#: [id number]  cc: Cheral Marker. Ola Spurr, MD, <Dictator> Yeray Tomas Ola Spurr MD ELECTRONICALLY SIGNED 04-01-2014 21:27

## 2014-06-30 NOTE — Discharge Summary (Signed)
PATIENT NAME:  Terry ProwsSELLARS, Nithin A MR#:  [id number] DATE OF BIRTH:  February 14, 1981  DATE OF ADMISSION:  03/03/2014 DATE OF DISCHARGE:  Jul 13, 2014   STAT DISCHARGE SUMMARY  The patient is being discharged to [facility].  DISCHARGE DIAGNOSES:  1.  Cerebral herniation.  2.  Multiple cerebral infarcts.  3.  Encephalopathy.  4.  Fever.   HISTORY OF PRESENT ILLNESS: Please see initial history and physical from 03/03/2014. Briefly, this is a 34 year old gentleman with a history of polysubstance abuse, including cocaine and marijuana, as well as prior schizophrenia, who [facility]. He was found unresponsive and brought in by security. He had some generalized tonic-clonic seizures in the ED. He was intubated for airway protection and started on Ativan and Keppra. He has been seen by neurology.   HOSPITAL COURSE BY ISSUE:  1.  CVA. CT scan showed multiple bilateral subacute and acute infarcts, consistent with an embolic source furious. The patient was treated broadly with IV antibiotics, including vancomycin and Zosyn. He had a lumbar puncture done, which showed over 300 white cells, mainly neutrophils. The patient remains intubated; however, on the morning of Jul 13, 2014, he was found to have an irregular pupils with left dilation and irregularity of his pupil. CT scan showed progression of the prior CVAs, but now evidence of stroke involving the majority of the left middle cervical artery territory and the left posterior cerebral artery territory. There was crown swelling with left to right shift of 14 mm and early uncal herniation. There was trapping of the right lateral ventricle. Upon review of this result, the patient was started on IV mannitol as well as hypertonic saline by neurology. [facility] is being [facility] for [facility] care, as well as potential neurosurgical evaluation. I have spoken with neurosurgery on call  at [facility], and they recommended transfer to the intensive care unit with neurology and potentially neurosurgery consultations. Prognosis is quite poor, and there would be limited neurosurgical options.  2.  Fever. The patient had recurrent fevers in his hospitalization, but his white count has been normal. Blood cultures x 2 were negative as well as urine culture and CSF culture. He was covered broadly with Zosyn and vancomycin. Zosyn was changed to meropenem today for better CNS penetration. His HIV test prelim was negative; final result is pending. RPR is pending.  3.  Transthoracic echocardiogram was done, which did not show any evidence of valvular vegetation, although suspicion is quite high for endocarditis with multiple cerebral embolic events.  4.  Pulmonary. The patient is intubated for airway protection and on a ventilator.   DISCHARGE MEDICATIONS::  1.  Acetaminophen.  2.  Lovenox deep vein thrombosis prophylactic treatment with 40 mg subcutaneous daily.  3.  Pepcid 20 mg b.i.d.  4.  Keppra injection 1000 mg q. 12 hours.  5.  Colace 100 mg twice a day.  6.  Sliding scale insulin.  7.  Phenytoin injection 100 mg q. 8 hours.  8.  Seroquel 25 mg twice a day.  9.  Senna 8.8 mg oral b.i.d.  10.  Ativan p.r.n. 1-2 mg IV for seizure activity.  11.  Mannitol 25% injection with 25 g infused every 4 hours over 90 minutes.  12.  Meropenem 2 g q. 8 hours.  13.  Metoprolol 50 mg q. 12 hours.  14.  Vancomycin 1000 mg q. 6 hours.   Discharge prognosis is quite poor.  He remains Full Code.   Dr. Daniel Nones as well as neurology discussed the case with family. They are aware of the dire prognosis.   TIME SPENT ON DISCHARGE: 45 minutes.    ____________________________ Stann Mainland. Sampson Goon, MD dpf:MT D: 2014-03-30 15:45:44 ET T: March 30, 2014 16:07:55 ET JOB#: [id number]  cc: Stann Mainland. Sampson Goon, MD, <Dictator> Merilynn Haydu Sampson Goon MD ELECTRONICALLY SIGNED 04/02/2014 21:27

## 2014-06-30 NOTE — Consult Note (Signed)
PATIENT NAME:  Terry Terry [name], Terry Terry [name] MR#:  Terry [name] DATE OF BIRTH:  [birth year]  DATE OF CONSULTATION:  03/04/2014  REFERRING PHYSICIAN:  Dr. Betti Cruzeddy CONSULTING PHYSICIAN:  Dwayne D. Callwood, MD  INDICATION: Altered mental status, possible septic emboli or thrombus with CVA.   HISTORY OF PRESENT ILLNESS: The patient is Terry [name] 34 year old African American male, resident at [facility], was brought by Terry [name] Engineer, materialssecurity officer after being found unresponsive in his cell. The patient was brought to the Emergency Room in unresponsive condition. He was evaluated in the Emergency Room, noted to have possible postictal state suggestive of possible seizure. Subsequently, the patient had generalized tonic-clonic seizures which persisted all through the stay in the emergency room. He required multiple doses of IV Ativan, Terry [name] loading dose of Keppra. Despite all of these efforts, the patient continued to have  He was intubated in the Emergency Room, put on  continued to have seizures and then received phenobarbital, maintained on Terry [name] ventilator, sedated. Workup in the emergency room, blood work was normal. CT scan of the head without contrast significant for diffuse bilateral acute versus subacute cerebral infarct from possible cardiac. The patient remained deeply sedated on mechanical ventilation under  sedation. No history was able to be obtained except from the ER physician and accompanying attendant security officer from the jail. The patient has Terry [name] history of schizophrenia treated with Haldol. He has been in prison since 02/07/2014.   REVIEW OF SYSTEMS: Unobtainable.   PAST MEDICAL HISTORY: Schizophrenia, alcohol and substance abuse.   PAST SURGICAL HISTORY: Unobtainable.   HOME MEDICATIONS: Haldol regularly, unclear dosage, frequency.   ALLERGIES: None.   FAMILY HISTORY: Unobtainable. The patient intubated and sedated.   SOCIAL HISTORY: He resides at [facility]. Substance abuse, schizophrenia.   Again,  review of systems unobtainable.   PHYSICAL EXAMINATION:  GENERAL: The patient intubated, sedated.  VITAL SIGNS: Blood pressure was 110/90, pulse of 110, regular respiratory rate on the vent.   HEENT: Normocephalic, atraumatic. Pupils equal and reactive to light, sedated.  NECK: Supple.  LUNGS: Clear.  HEART: Tachycardic. Systolic ejection murmur left sternal border.  ABDOMEN: Benign.  EXTREMITIES: Within normal limits.  NEUROLOGIC: Unable to be assessed as the patient is sedated.  SKIN: Normal.    LABORATORIES: Chest x-ray unremarkable. CT, again, with multiple emboli, unclear source, possibly cardiac.   EKG: Sinus tachycardia, nonspecific findings.   ASSESSMENT: Altered mental status, possible cerebrovascular accident, possible emboli, septic versus thrombotic. Reported substance abuse, reported schizophrenia, seizures, alcohol abuse.   PLAN: Agree with admit for respiratory support. Continue medications for his seizures. Anticoagulation for potential thrombotic emboli. Would consider and recommend broad-spectrum antibiotics for possible septic emboli. Baseline echocardiogram will be helpful. Consider TEE if the patient improves, looking for source of emboli. There is no evidence of fever or white count at this point. No source of infection. Will continue DVT prophylaxis, continue reflux prophylaxis, continue current support, continue neurological therapy and treatment for persistent fevers presumably related to emboli to the brain and CVA. Would again consider TEE, but for now will hold off until the patient improves neurologically and treat the patient empirically for endocarditis, as well as thrombus. Will base further evaluation on whether he responds neurologically and neurology input.     ____________________________ Bobbie Stackwayne D. Juliann Paresallwood, MD ddc:at D: 03/04/2014 15:07:48 ET T: 03/04/2014 16:16:39 ET JOB#: [id number]  cc: Dwayne D. Juliann Paresallwood, MD, <Dictator> Alwyn PeaWAYNE D CALLWOOD  MD ELECTRONICALLY SIGNED 03/24/2014 14:46

## 2015-11-01 IMAGING — CT CT HEAD WITHOUT CONTRAST
4 series · 18 of 30 positions shown, 19 images · non-contrast
Comparison: None.

CLINICAL DATA: Altered mental status.

EXAM:
CT HEAD WITHOUT CONTRAST
TECHNIQUE: Contiguous axial images were obtained from the base of the skull
through the vertex without intravenous contrast.

[Series 3: head bone · axial · 0.41mm/px · z∈[-120,-26]mm · 6 of 87 slices shown]
[im 8/87  bone]
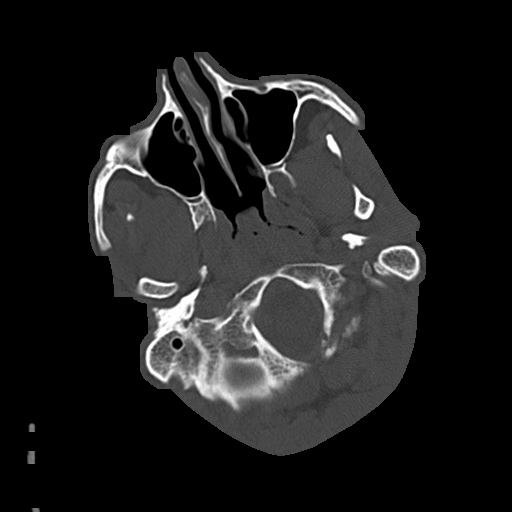
[im 16/87  bone]
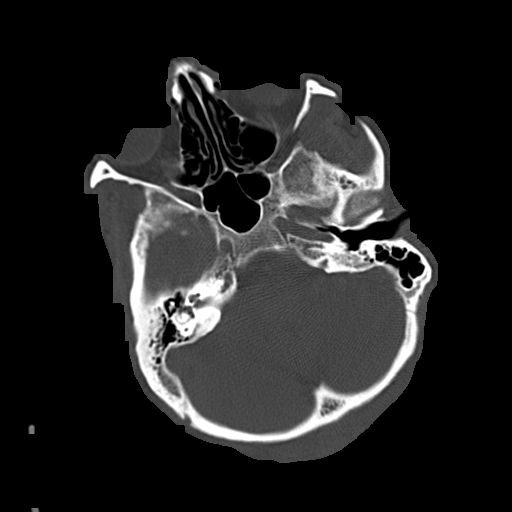
[im 32/87  bone]
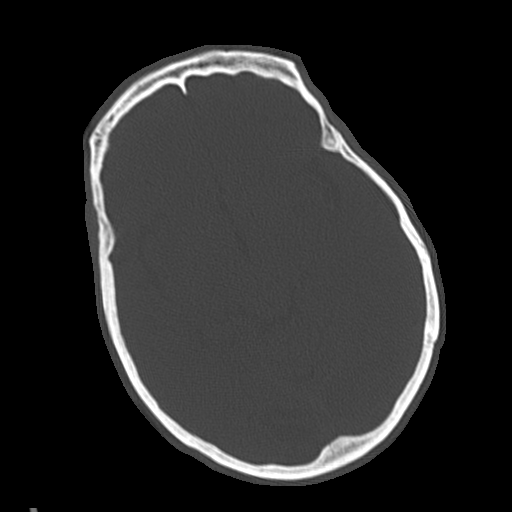
[im 40/87  bone]
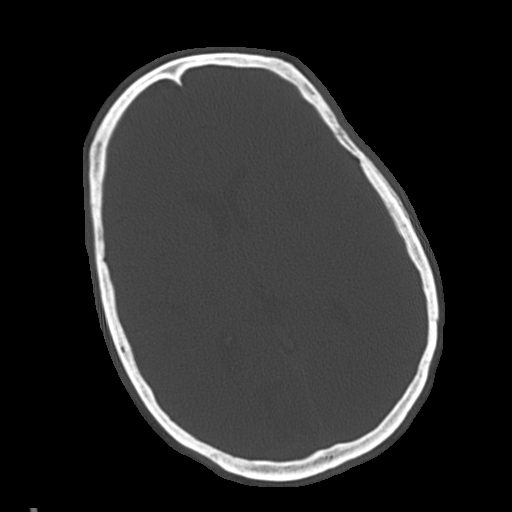
[im 47/87  bone]
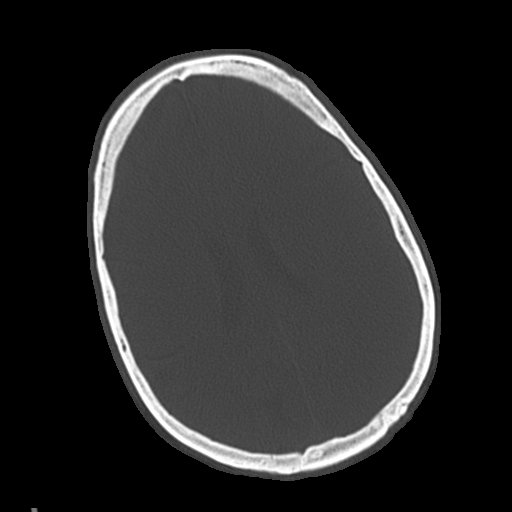
[im 55/87  bone]
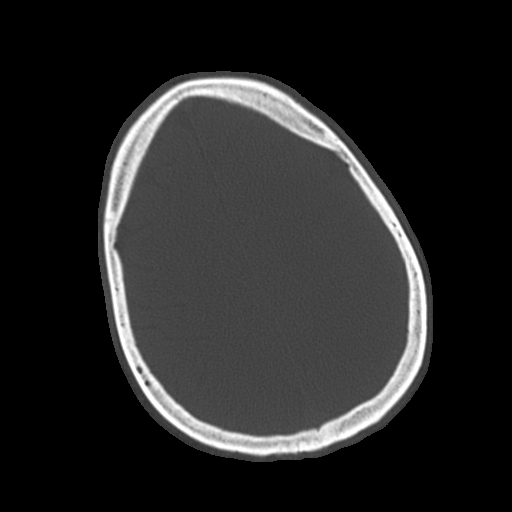

[Series 4: head wo · axial · 0.41mm/px · z∈[-74,-24]mm · 2 of 32 slices shown, 3 images]
[im 11/32  brain]
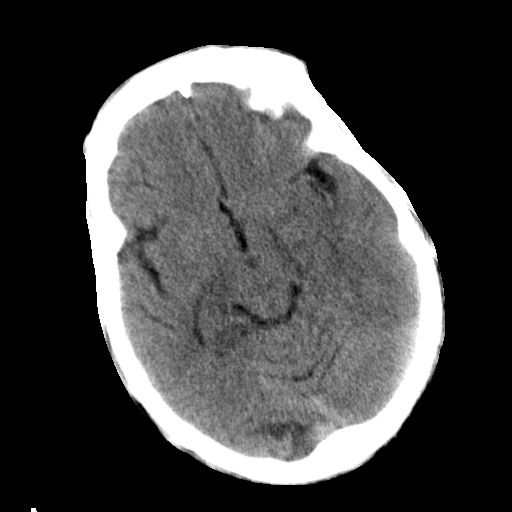
[im 11/32  bone]
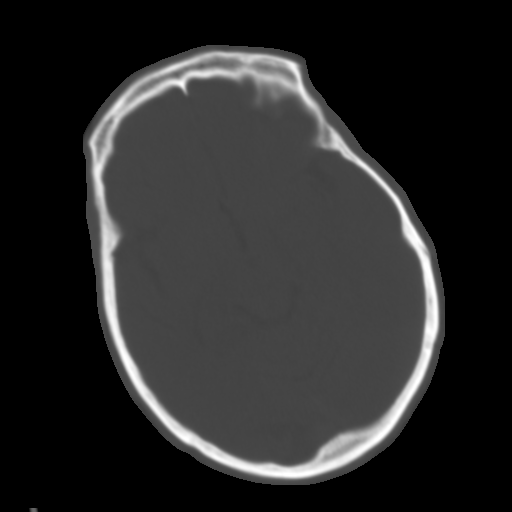
[im 21/32  brain]
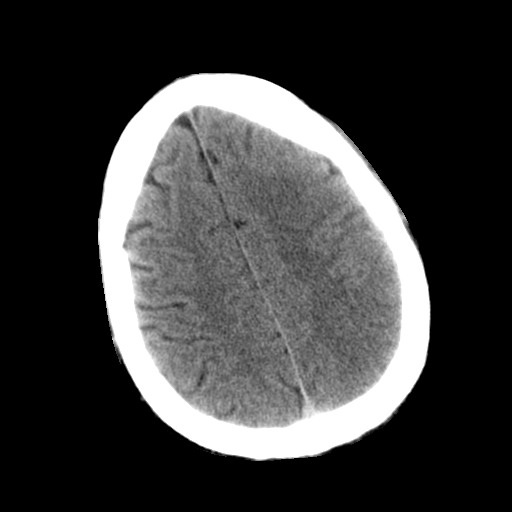

[Series 5: head wo recons · axial · 0.41mm/px · z∈[-31,+14]mm · 2 of 30 slices shown]
[im 10/30  brain]
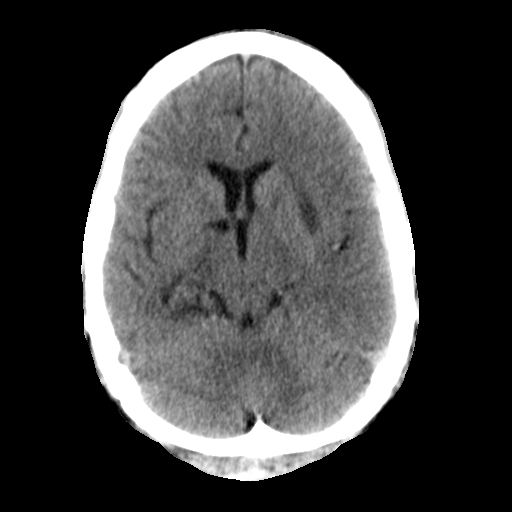
[im 20/30  brain]
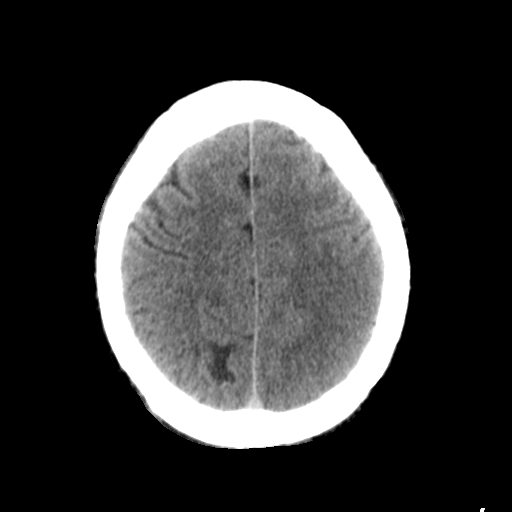

[Series 6: head bonerecons · axial · 0.41mm/px · z∈[-67,+34]mm · 8 of 72 slices shown]
[im 8/72  brain]
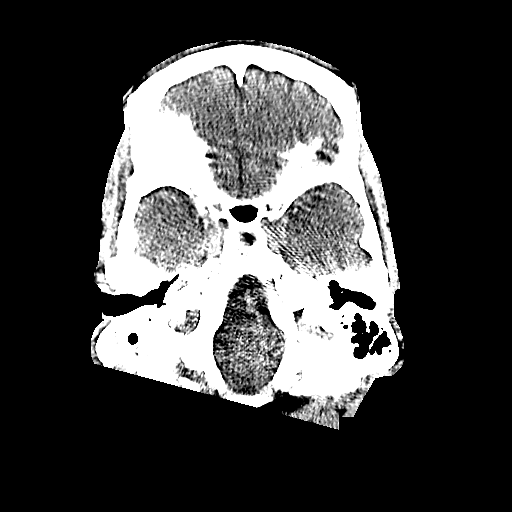
[im 16/72  brain]
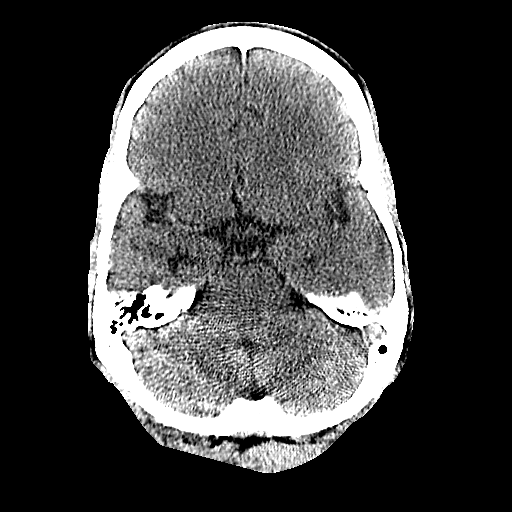
[im 24/72  brain]
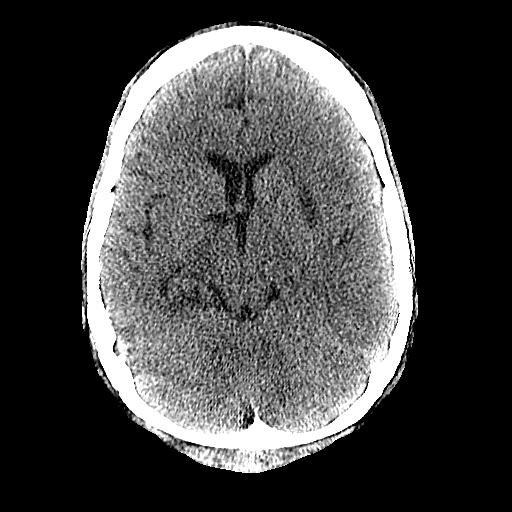
[im 32/72  brain]
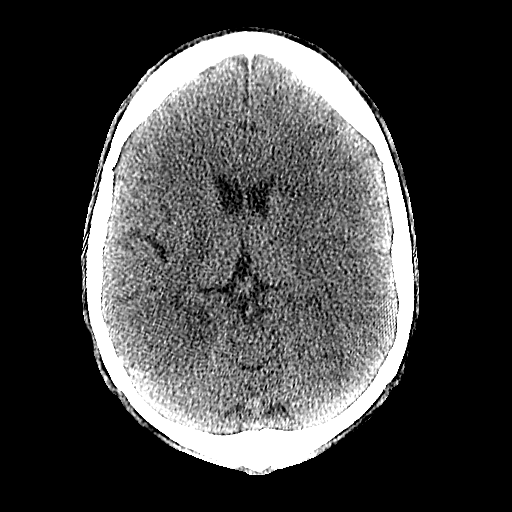
[im 40/72  brain]
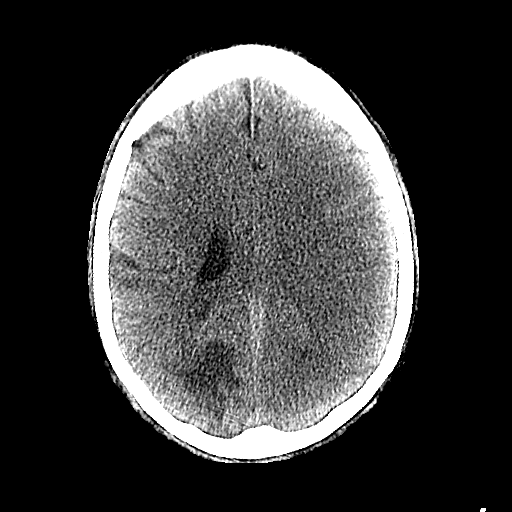
[im 48/72  brain]
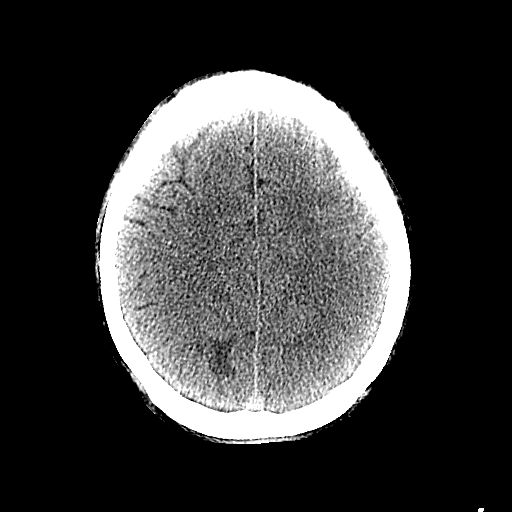
[im 56/72  brain]
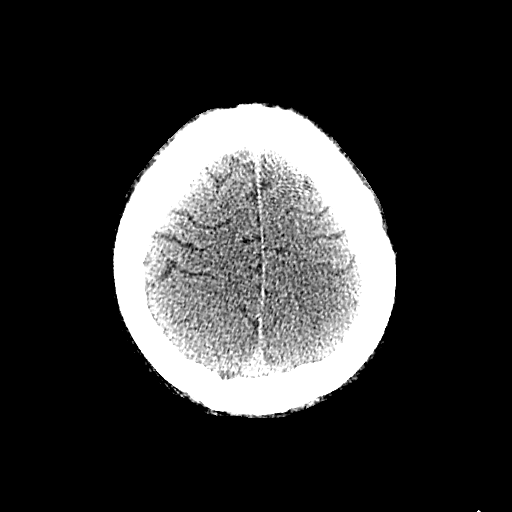
[im 64/72  brain]
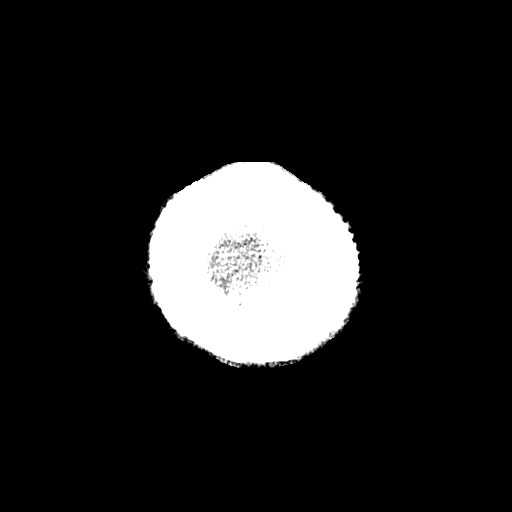

[18 of 30 positions shown; findings below may reference images not displayed]

FINDINGS: Skull and Sinuses:Negative for fracture or destructive process. The
mastoids, middle ears, and imaged paranasal sinuses are clear.

Orbits: No acute abnormality.

Brain: There is low-attenuation conforming to multiple vascular
distributions consistent with infarcs:

*well-circumscribed, subacute or remote appearing infarct at the
genu of the right internal capsule that is small.
*Established but recent appearing infarct in the right PCA
territory, affecting the occipital lobe and mesial temporal lobe.
*Established but recent appearing infarct in the left lateral
lenticulostriate distribution affecting the caudate and putamen
nuclei.
*Small cortical and subcortical infarct involving the right cerebral
hemisphere at the mid sylvian fissure.
*Small peripheral infarcts present along the left cerebral convexity
which appear acute.
*There is sulcal effacement and subtle gray-white smudging involving
the left occipital lobe which may represent acute ischemia.
No hematoma, hydrocephalus, or shift.

Critical Value/emergent results were called by telephone at the time
of interpretation on 03/03/2014 at [DATE] to Dr. ELVIN BARNABY ,
who verbally acknowledged these results.
IMPRESSION: 1. Acute and subacute bilateral cerebral infarcts, as detailed
above. The pattern suggests cardiogenic emboli.
2. No intracranial hemorrhage.

## 2015-11-01 IMAGING — CR DG CHEST 1V PORT
1 series · 1 of 1 positions shown · non-contrast
Comparison: 11/17/2009

CLINICAL DATA: Post intubation

EXAM:
PORTABLE CHEST - 1 VIEW

[ap]
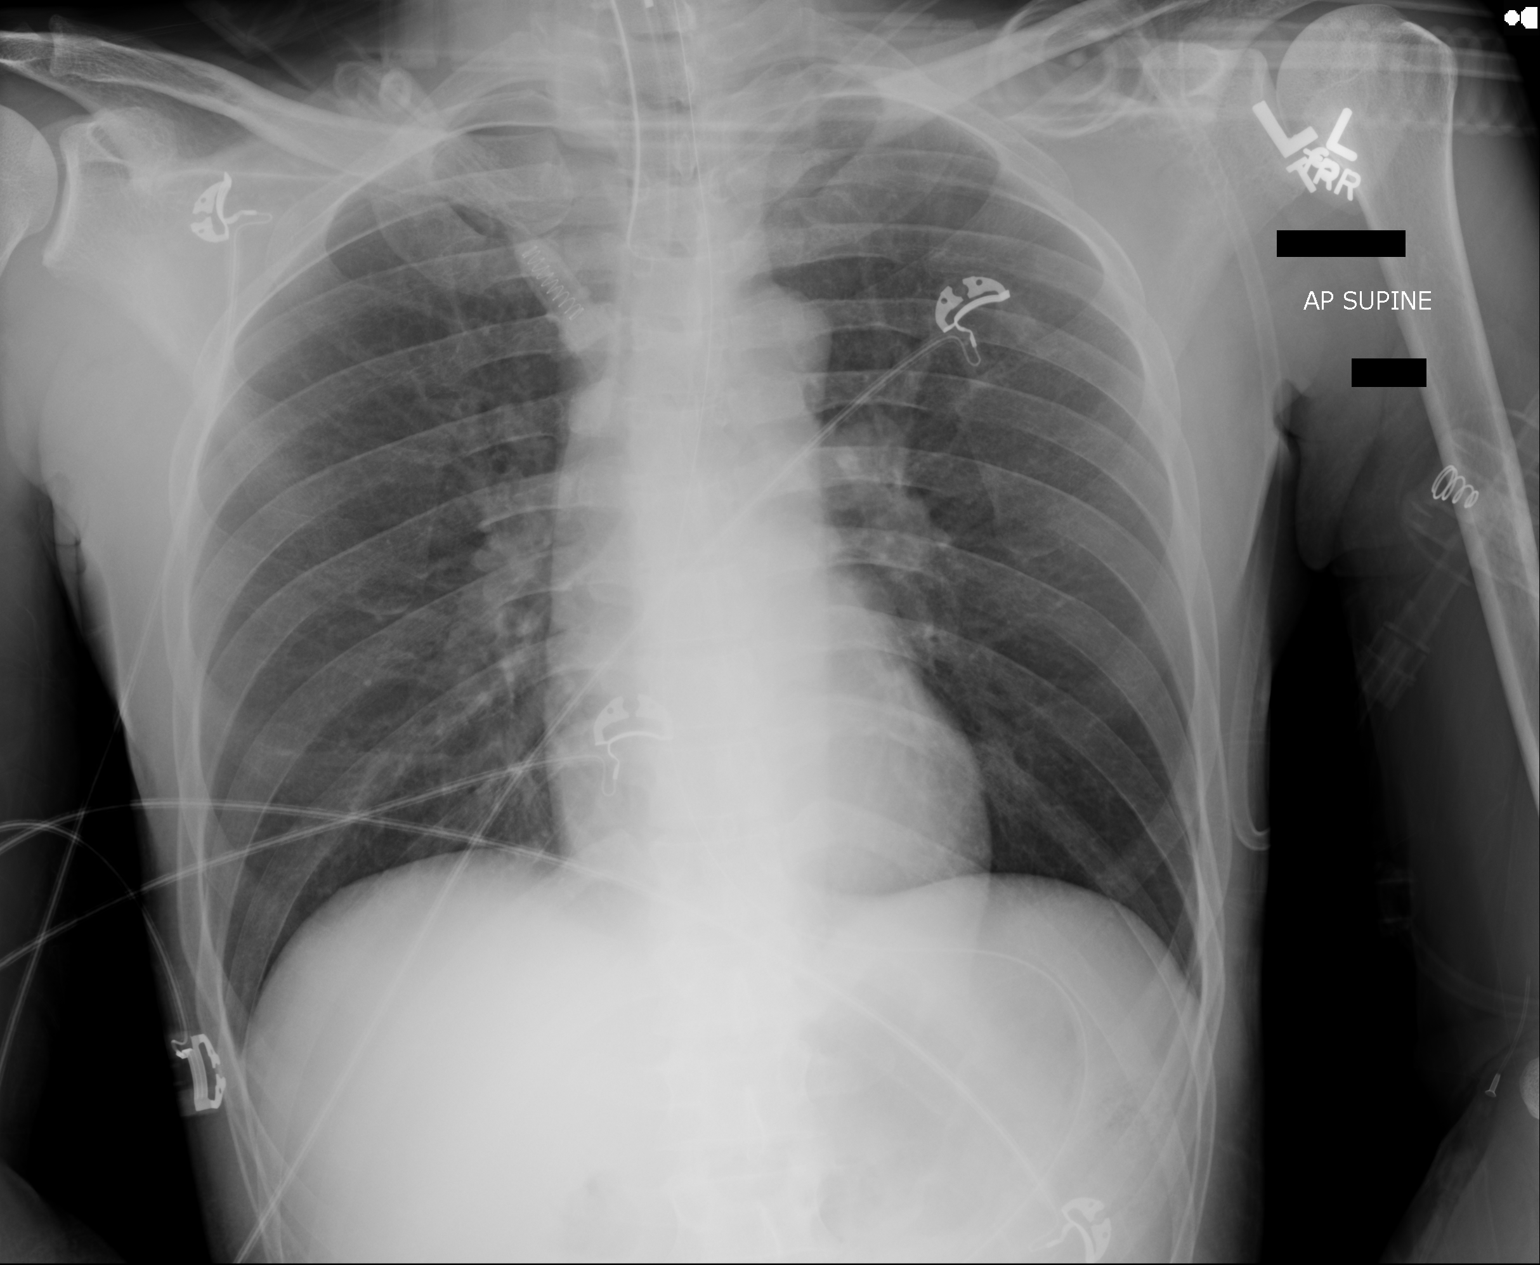

[1 of 1 positions shown; findings below may reference images not displayed]

FINDINGS: Endotracheal tube with tip just below the clavicular heads. Gastric
suction tube reaches the stomach, which remains mildly gas
distended.

Normal heart size and mediastinal contours. There is no edema,
consolidation, effusion, or pneumothorax.
IMPRESSION: 1. Endotracheal and orogastric tubes are in good position.
2. No evidence of intrathoracic disease.
